# Patient Record
Sex: Female | Born: 1975 | Race: White | Hispanic: No | Marital: Married | Smoking: Never smoker
Health system: Southern US, Community
[De-identification: ages and names within clinical notes are randomized; demographics above are authoritative.]

## PROBLEM LIST (undated history)

## (undated) DIAGNOSIS — S62109A Fracture of unspecified carpal bone, unspecified wrist, initial encounter for closed fracture: Secondary | ICD-10-CM

## (undated) DIAGNOSIS — R519 Headache, unspecified: Secondary | ICD-10-CM

## (undated) DIAGNOSIS — R011 Cardiac murmur, unspecified: Secondary | ICD-10-CM

## (undated) DIAGNOSIS — R51 Headache: Secondary | ICD-10-CM

## (undated) HISTORY — DX: Cardiac murmur, unspecified: R01.1

## (undated) HISTORY — PX: WISDOM TOOTH EXTRACTION: SHX21

---

## 2007-03-27 ENCOUNTER — Inpatient Hospital Stay (HOSPITAL_COMMUNITY): Admission: AD | Admit: 2007-03-27 | Discharge: 2007-03-29 | Payer: Self-pay | Admitting: Certified Nurse Midwife

## 2008-12-10 ENCOUNTER — Inpatient Hospital Stay (HOSPITAL_COMMUNITY): Admission: AD | Admit: 2008-12-10 | Discharge: 2008-12-11 | Payer: Self-pay | Admitting: Obstetrics and Gynecology

## 2010-07-31 ENCOUNTER — Inpatient Hospital Stay (HOSPITAL_COMMUNITY)
Admission: AD | Admit: 2010-07-31 | Discharge: 2010-08-01 | Payer: Self-pay | Source: Home / Self Care | Attending: Obstetrics & Gynecology | Admitting: Obstetrics & Gynecology

## 2010-08-06 LAB — RH IMMUNE GLOB WKUP(>/=20WKS)(NOT WOMEN'S HOSP)
Fetal Screen: NEGATIVE
Unit division: 0

## 2010-08-06 LAB — RPR: RPR Ser Ql: NONREACTIVE

## 2010-08-06 LAB — CBC
HCT: 30.3 % — ABNORMAL LOW (ref 36.0–46.0)
Hemoglobin: 10.2 g/dL — ABNORMAL LOW (ref 12.0–15.0)
MCH: 30.2 pg (ref 26.0–34.0)
MCHC: 33.7 g/dL (ref 30.0–36.0)
MCV: 89.6 fL (ref 78.0–100.0)
Platelets: 206 10*3/uL (ref 150–400)
RBC: 3.38 MIL/uL — ABNORMAL LOW (ref 3.87–5.11)
RDW: 13.6 % (ref 11.5–15.5)
WBC: 11.8 10*3/uL — ABNORMAL HIGH (ref 4.0–10.5)

## 2010-10-30 LAB — CBC
HCT: 32 % — ABNORMAL LOW (ref 36.0–46.0)
Hemoglobin: 11.1 g/dL — ABNORMAL LOW (ref 12.0–15.0)
MCHC: 34.8 g/dL (ref 30.0–36.0)
MCV: 91.8 fL (ref 78.0–100.0)
Platelets: 190 10*3/uL (ref 150–400)
RBC: 3.48 MIL/uL — ABNORMAL LOW (ref 3.87–5.11)
RDW: 14.2 % (ref 11.5–15.5)
WBC: 13.6 10*3/uL — ABNORMAL HIGH (ref 4.0–10.5)

## 2010-10-30 LAB — RH IMMUNE GLOB WKUP(>/=20WKS)(NOT WOMEN'S HOSP): Fetal Screen: NEGATIVE

## 2010-10-30 LAB — RPR: RPR Ser Ql: NONREACTIVE

## 2011-05-01 ENCOUNTER — Encounter: Payer: Self-pay | Admitting: *Deleted

## 2011-05-02 ENCOUNTER — Encounter: Payer: Self-pay | Admitting: Cardiovascular Disease

## 2011-05-02 ENCOUNTER — Ambulatory Visit (HOSPITAL_COMMUNITY): Payer: 59 | Attending: Cardiovascular Disease | Admitting: Radiology

## 2011-05-02 ENCOUNTER — Ambulatory Visit (INDEPENDENT_AMBULATORY_CARE_PROVIDER_SITE_OTHER): Payer: Commercial Managed Care - PPO | Admitting: Cardiovascular Disease

## 2011-05-02 VITALS — BP 123/67 | HR 75 | Ht 62.0 in | Wt 143.0 lb

## 2011-05-02 DIAGNOSIS — O99891 Other specified diseases and conditions complicating pregnancy: Secondary | ICD-10-CM | POA: Insufficient documentation

## 2011-05-02 DIAGNOSIS — R011 Cardiac murmur, unspecified: Secondary | ICD-10-CM | POA: Insufficient documentation

## 2011-05-02 DIAGNOSIS — Z331 Pregnant state, incidental: Secondary | ICD-10-CM | POA: Insufficient documentation

## 2011-05-02 NOTE — Assessment & Plan Note (Signed)
F/U Barbara Bishop.  No complications to date

## 2011-05-02 NOTE — Assessment & Plan Note (Signed)
Outflow tract AV murmur not mitral.  Echo  Think it is benign.

## 2011-05-02 NOTE — Progress Notes (Signed)
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 Referred by [facility] for murmur.  History of murmur since young.  Reviewed echo from 2003 and only physiologic MR and TR with no MVP.  Study done at [facility] in [place].  Patient has 4 children and just discovered she is pregnant again.  No due date set yet.  No problems with previous pregnancies.  Active just ran 5K.  No SSCP, dyspne palpitations or edema.  Other children range in age from 18 months to 7 years.  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  No preeclapsia or gestational DM history  ROS: Denies fever, malais, weight loss, blurry vision, decreased visual acuity, cough, sputum, SOB, hemoptysis, pleuritic pain, palpitaitons, heartburn, abdominal pain, melena, lower extremity edema, claudication, or rash.  All other systems reviewed and negative   General: Affect appropriate Healthy:  appears stated age HEENT: normal Neck supple with no adenopathy JVP normal no bruits no thyromegaly Lungs clear with no wheezing and good diaphragmatic motion Heart:  S1/S2 SEM aortic no ,rub, gallop or click PMI normal Abdomen: benighn, BS positve, no tenderness, no AAA no bruit.  No HSM or HJR Distal pulses intact with no bruits No edema Neuro non-focal Skin warm and dry No muscular weakness  Medications Current Outpatient Prescriptions  Medication Sig Dispense Refill  . acetaminophen (TYLENOL) 325 MG tablet Take 650 mg by mouth every 6 (six) hours as needed.        . Prenatal MV-Min-Fe Fum-FA-DHA (PRENATAL 1 PO) Take 1 tablet by mouth daily.          Allergies Review of patient's allergies indicates no known allergies.  Family History: Family History  Problem Relation Age of Onset  . Bipolar disorder    . Eating disorder    . Anxiety disorder    . Fibromyalgia    . Heart disease      Social History: History   Social History  . Marital Status: Married    Spouse Name: N/A    Number of Children: N/A  . Years of  Education: N/A   Occupational History  . Not on file.   Social History Main Topics  . Smoking status: Never Smoker   . Smokeless tobacco: Not on file  . Alcohol Use: Yes  . Drug Use: Not on file  . Sexually Active: Not on file   Other Topics Concern  . Not on file   Social History Narrative  . No narrative on file    Electrocardiogram:  NSR 69 normal ECG  Assessment and Plan

## 2011-05-02 NOTE — Patient Instructions (Signed)
Your physician has requested that you have an echocardiogram. Echocardiography is a painless test that uses sound waves to create images of your heart. It provides your doctor with information about the size and shape of your heart and how well your heart's chambers and valves are working. This procedure takes approximately one hour. There are no restrictions for this procedure.   

## 2011-05-03 LAB — CBC
HCT: 33.4 — ABNORMAL LOW
Hemoglobin: 11.6 — ABNORMAL LOW
MCHC: 34.8
MCV: 86.8
Platelets: 281
RBC: 3.84 — ABNORMAL LOW
RDW: 13.9
WBC: 18.5 — ABNORMAL HIGH

## 2011-05-03 LAB — RPR: RPR Ser Ql: NONREACTIVE

## 2011-06-19 ENCOUNTER — Encounter: Payer: Self-pay | Admitting: Cardiovascular Disease

## 2011-07-23 NOTE — L&D Delivery Note (Signed)
Delivery Note    Into water tub at 0500, water temp 100F Out of tub at 0600 for void, ambulated in hallway x 15 min then returned to tub at 0620 Complete dilation at 0626 and SROM clear AF Onset of pushing at 0626, one push for head FHR second stage cat 1, intermittent EFM  Analgesia /Anesthesia intrapartum: none  Delivery of a viable female at [phone] by CNM in OA position. Patient kneeling on one knee in water Nuchal Cord x2, and around arm x 1, unwound after delivery. Cord double clamped after cessation of pulsation, cut by FOB.  Cord blood sample collected.  Arterial cord blood sample not done. Pt out of water for placenta delivery. Placenta delivered shultz intact with 3 VC.  Placenta to L&D for disposal. Uterine tone firm w/ massage and Pitocin 10 units IM, bleeding small  1st degree vaginal laceration identified.  Anesthesia: local lidocaine Repair 3.0 vycril in standard fashion Est. Blood Loss (mL): 200cc  Complications: none  Mom to postpartum.  Baby to mother for skin to skin care.  Sharri Loya 12/13/2011, 6:57 AM

## 2011-09-17 LAB — OB RESULTS CONSOLE ABO/RH

## 2011-09-17 LAB — OB RESULTS CONSOLE GC/CHLAMYDIA: Gonorrhea: NEGATIVE

## 2011-09-17 LAB — OB RESULTS CONSOLE ANTIBODY SCREEN: Antibody Screen: NEGATIVE

## 2011-11-19 LAB — OB RESULTS CONSOLE GBS: GBS: NEGATIVE

## 2011-12-13 ENCOUNTER — Inpatient Hospital Stay (HOSPITAL_COMMUNITY)
Admission: AD | Admit: 2011-12-13 | Discharge: 2011-12-14 | DRG: 775 | Disposition: A | Discharge status: Home / Self Care | Payer: 59 | Source: Ambulatory Visit | Attending: Obstetrics and Gynecology | Admitting: Obstetrics and Gynecology

## 2011-12-13 ENCOUNTER — Encounter (HOSPITAL_COMMUNITY): Payer: Self-pay | Admitting: Obstetrics

## 2011-12-13 DIAGNOSIS — D62 Acute posthemorrhagic anemia: Secondary | ICD-10-CM | POA: Diagnosis not present

## 2011-12-13 DIAGNOSIS — O9903 Anemia complicating the puerperium: Secondary | ICD-10-CM | POA: Diagnosis not present

## 2011-12-13 LAB — RPR: RPR Ser Ql: NONREACTIVE

## 2011-12-13 LAB — CBC
Hemoglobin: 11.5 g/dL — ABNORMAL LOW (ref 12.0–15.0)
MCHC: 32.9 g/dL (ref 30.0–36.0)
RDW: 13.3 % (ref 11.5–15.5)
WBC: 11.2 10*3/uL — ABNORMAL HIGH (ref 4.0–10.5)

## 2011-12-13 MED ORDER — ACETAMINOPHEN 325 MG PO TABS: 650.0000 mg | ORAL_TABLET | ORAL | Status: DC | PRN

## 2011-12-13 MED ORDER — WITCH HAZEL-GLYCERIN EX PADS: 1.0000 "application " | MEDICATED_PAD | CUTANEOUS | Status: DC | PRN

## 2011-12-13 MED ORDER — LIDOCAINE HCL (PF) 1 % IJ SOLN
30.0000 mL | INTRAMUSCULAR | Status: DC | PRN
  Administered 2011-12-13: 30 mL via SUBCUTANEOUS
  Filled 2011-12-13: qty 30

## 2011-12-13 MED ORDER — OXYTOCIN BOLUS FROM INFUSION
500.0000 mL | Freq: Once | INTRAVENOUS | Status: DC
  Filled 2011-12-13: qty 500

## 2011-12-13 MED ORDER — TETANUS-DIPHTH-ACELL PERTUSSIS 5-2.5-18.5 LF-MCG/0.5 IM SUSP: 0.5000 mL | Freq: Once | INTRAMUSCULAR | Status: DC

## 2011-12-13 MED ORDER — OXYTOCIN 10 UNIT/ML IJ SOLN
10.0000 [IU] | Freq: Once | INTRAMUSCULAR | Status: DC
  Administered 2011-12-13: 10 [IU] via INTRAMUSCULAR
  Filled 2011-12-13: qty 2

## 2011-12-13 MED ORDER — FLEET ENEMA 7-19 GM/118ML RE ENEM: 1.0000 | ENEMA | Freq: Every day | RECTAL | Status: DC | PRN

## 2011-12-13 MED ORDER — SENNOSIDES-DOCUSATE SODIUM 8.6-50 MG PO TABS
2.0000 | ORAL_TABLET | Freq: Every day | ORAL | Status: DC
  Administered 2011-12-13: 2 via ORAL

## 2011-12-13 MED ORDER — ONDANSETRON HCL 4 MG/2ML IJ SOLN: 4.0000 mg | INTRAMUSCULAR | Status: DC | PRN

## 2011-12-13 MED ORDER — FLEET ENEMA 7-19 GM/118ML RE ENEM: 1.0000 | ENEMA | RECTAL | Status: DC | PRN

## 2011-12-13 MED ORDER — ONDANSETRON HCL 4 MG PO TABS: 4.0000 mg | ORAL_TABLET | ORAL | Status: DC | PRN

## 2011-12-13 MED ORDER — CITRIC ACID-SODIUM CITRATE 334-500 MG/5ML PO SOLN: 30.0000 mL | ORAL | Status: DC | PRN

## 2011-12-13 MED ORDER — LANOLIN HYDROUS EX OINT: TOPICAL_OINTMENT | CUTANEOUS | Status: DC | PRN

## 2011-12-13 MED ORDER — PRENATAL MULTIVITAMIN CH
1.0000 | ORAL_TABLET | Freq: Every day | ORAL | Status: DC
  Administered 2011-12-13 – 2011-12-14 (×2): 1 via ORAL
  Filled 2011-12-13: qty 1

## 2011-12-13 MED ORDER — IBUPROFEN 600 MG PO TABS
600.0000 mg | ORAL_TABLET | Freq: Four times a day (QID) | ORAL | Status: DC
  Administered 2011-12-13 – 2011-12-14 (×2): 600 mg via ORAL
  Filled 2011-12-13 (×4): qty 1

## 2011-12-13 MED ORDER — IBUPROFEN 600 MG PO TABS: 600.0000 mg | ORAL_TABLET | Freq: Four times a day (QID) | ORAL | Status: DC | PRN

## 2011-12-13 MED ORDER — SIMETHICONE 80 MG PO CHEW: 80.0000 mg | CHEWABLE_TABLET | ORAL | Status: DC | PRN

## 2011-12-13 MED ORDER — DIBUCAINE 1 % RE OINT
1.0000 "application " | TOPICAL_OINTMENT | RECTAL | Status: DC | PRN
  Filled 2011-12-13: qty 28

## 2011-12-13 MED ORDER — OXYTOCIN 20 UNITS IN LACTATED RINGERS INFUSION - SIMPLE: 125.0000 mL/h | Freq: Once | INTRAVENOUS | Status: DC

## 2011-12-13 MED ORDER — OXYCODONE-ACETAMINOPHEN 5-325 MG PO TABS: 1.0000 | ORAL_TABLET | ORAL | Status: DC | PRN

## 2011-12-13 MED ORDER — ZOLPIDEM TARTRATE 5 MG PO TABS: 5.0000 mg | ORAL_TABLET | Freq: Every evening | ORAL | Status: DC | PRN

## 2011-12-13 MED ORDER — BISACODYL 10 MG RE SUPP
10.0000 mg | Freq: Every day | RECTAL | Status: DC | PRN
  Filled 2011-12-13: qty 1

## 2011-12-13 MED ORDER — BENZOCAINE-MENTHOL 20-0.5 % EX AERO
1.0000 "application " | INHALATION_SPRAY | CUTANEOUS | Status: DC | PRN
  Administered 2011-12-13: 1 via TOPICAL
  Filled 2011-12-13 (×2): qty 56

## 2011-12-13 MED ORDER — LACTATED RINGERS IV SOLN: 500.0000 mL | INTRAVENOUS | Status: DC | PRN

## 2011-12-13 MED ORDER — ONDANSETRON HCL 4 MG/2ML IJ SOLN: 4.0000 mg | Freq: Four times a day (QID) | INTRAMUSCULAR | Status: DC | PRN

## 2011-12-13 MED ORDER — DIPHENHYDRAMINE HCL 25 MG PO CAPS: 25.0000 mg | ORAL_CAPSULE | Freq: Four times a day (QID) | ORAL | Status: DC | PRN

## 2011-12-13 NOTE — Progress Notes (Signed)
Patient and baby were walked to [facility] by RN. RN gave report to [facility] RN, Donzetta Sprung.

## 2011-12-13 NOTE — H&P (Signed)
  OB ADMISSION/ HISTORY & PHYSICAL:  Admission Date: 12/13/2011  4:01 AM  Admit Diagnosis: 1. Term pregnancy  2. Contractions, hx of precipitous labor.  Barbara Bishop is a 36 y.o. female presenting for labor. Onset of painful ctx at 0100, small bloody show, + back pain. Plans water birth.  Prenatal History: [id number], SVD x4   EDC : 12/14/2011, by Other Basis  Prenatal care at [facility] since [redacted] weeks gestation  Prenatal course complicated by hx precipitous labor, Rh negative  Prenatal Labs: ABO, Rh: O (02/26 0000)  Antibody: Negative (02/26 0000) Rubella: Immune (02/26 0000)  RPR: Nonreactive (02/26 0000)  HBsAg:   negative HIV: Non-reactive (02/26 0000)  GBS: Negative (04/30 0000)  1 hr Glucola : 123 18 wks sono - nl anatomy, anterior placenta, unreported gender  Medical / Surgical History :  Past medical history:  Past Medical History  Diagnosis Date  . Murmur      Past surgical history:  Past Surgical History  Procedure Date  . Wisdom tooth extraction      Family History:  Family History  Problem Relation Age of Onset  . Bipolar disorder    . Eating disorder    . Anxiety disorder    . Fibromyalgia    . Heart disease       Social History:  reports that she has never smoked. She does not have any smokeless tobacco history on file. She reports that she does not drink alcohol. Her drug history not on file.   Allergies: Review of patient's allergies indicates no known allergies.    Current Medications at time of admission:  Prescriptions prior to admission  Medication Sig Dispense Refill  . acetaminophen (TYLENOL) 325 MG tablet Take 650 mg by mouth every 6 (six) hours as needed.        . Prenatal MV-Min-Fe Fum-FA-DHA (PRENATAL 1 PO) Take 1 tablet by mouth daily.            Review of Systems: Mild nausea w/ ctx, back pain, small bloody show, + fetal movement Coping well w/ ctx.   Physical Exam:  Dilation: 8 Exam by:: D Dreux Mcgroarty  CNM Filed Vitals:   12/13/11 0415 12/13/11 0431  BP: 138/67   Pulse: 82   Temp: 98 F (36.7 C)   TempSrc: Oral   Resp: 18   Height:  5\' 2"  (1.575 m)  Weight:  77.111 kg (170 lb)   General: AAO x 3, NAD Heart:RRR Lungs:CTAB Abdomen: nt, relaxed tone, EFW 7.5 Extremities: trace edema Genitalia / VE: 8/90/-2, ballotable vertex FHR: 135 BL, moderate variability, + accel's, no decel's TOCO: Q5-6 min, moderate palp  Labs:    CBC/RPR pending   Assessment: Term pregnancy, advanced dilation Desires water birth GBS neg  Plan:  Admit to BS Routine L&D orders Prep room for water birth Anticipate transition shortly, will AROM PRN  Barbara Bishop 12/13/2011, 4:44 AM

## 2011-12-14 LAB — CBC
Hemoglobin: 9.3 g/dL — ABNORMAL LOW (ref 12.0–15.0)
MCH: 29.2 pg (ref 26.0–34.0)
RBC: 3.18 MIL/uL — ABNORMAL LOW (ref 3.87–5.11)
WBC: 10.8 10*3/uL — ABNORMAL HIGH (ref 4.0–10.5)

## 2011-12-14 MED ORDER — IBUPROFEN 600 MG PO TABS: ORAL_TABLET | ORAL | Status: DC

## 2011-12-14 MED ORDER — RHO D IMMUNE GLOBULIN 1500 UNIT/2ML IJ SOLN
300.0000 ug | Freq: Once | INTRAMUSCULAR | Status: AC
  Administered 2011-12-14: 300 ug via INTRAMUSCULAR
  Filled 2011-12-14: qty 2

## 2011-12-14 NOTE — Discharge Summary (Signed)
Obstetric Discharge Summary  Reason for Admission: onset of labor Prenatal Procedures: none Intrapartum Procedures: spontaneous vaginal delivery and water birth Postpartum Procedures: none Complications-Operative and Postpartum: mild acute blood loss anemia Hemoglobin  Date Value Range Status  12/14/2011 9.3* 12.0-15.0 (g/dL) Final     DELTA CHECK NOTED     REPEATED TO VERIFY     HCT  Date Value Range Status  12/14/2011 27.9* 36.0-46.0 (%) Final    Physical Exam:  General: alert, cooperative and no distress Lochia: appropriate Uterine Fundus: firm Incision: healing well DVT Evaluation: No evidence of DVT seen on physical exam.  Discharge Diagnoses: Term Pregnancy-delivered  Discharge Information: Date: 12/14/2011 Activity: pelvic rest Diet: routine Medications: PNV and Ibuprofen Condition: stable Instructions: refer to practice specific booklet Discharge to: home Follow-up Information    Follow up with [name], CNM. Schedule an appointment as soon as possible for a visit in 6 weeks.   Contact information:   [address] [place] [address] [phone]          Newborn Data: Live born female  Birth Weight: 6 lb 6 oz (2892 g) APGAR: 7, 9  Home with mother.  [name] 12/14/2011, 10:44 AM

## 2011-12-14 NOTE — Progress Notes (Signed)
Patient ID: Barbara Bishop, female   DOB: Feb 08, 1976, 36 y.o.   MRN: [id number]  PPD 1 SVD  S:  Reports feeling well / ready for discharge home             Tolerating po/ No nausea or vomiting             Bleeding is light             Pain controlled with occasional motrin             Up ad lib / ambulatory  Newborn breast feeding  / female newborn  O:  A & O x 3 NAD             VS: Blood pressure 100/64, pulse 64, temperature 97.6 F (36.4 C), temperature source Oral, resp. rate 18, height 5\' 2"  (1.575 m), weight 77.111 kg (170 lb), SpO2 97.00%, unknown if currently breastfeeding.  LABS: WBC/Hgb/Hct/Plts:  10.8/9.3/27.9/206 (05/25 0532)   Lungs: Clear and unlabored  Heart: regular rate and rhythm / no mumurs  Abdomen: soft, non-tender, non-distended              Fundus: firm, non-tender  Lochia: light  Extremities: no edema, no calf pain or tenderness  A: PPD # 1   Doing well - stable status P:  Routine post partum orders  Discharge to home  [name] 12/14/2011, 10:40 AM

## 2011-12-16 LAB — RH IG WORKUP (INCLUDES ABO/RH)
Antibody Screen: NEGATIVE
Fetal Screen: NEGATIVE
Gestational Age(Wks): 39.6

## 2014-02-26 ENCOUNTER — Encounter: Payer: 59 | Attending: Family Medicine | Admitting: Dietician

## 2014-02-26 DIAGNOSIS — Z6831 Body mass index (BMI) 31.0-31.9, adult: Secondary | ICD-10-CM | POA: Insufficient documentation

## 2014-02-26 DIAGNOSIS — Z713 Dietary counseling and surveillance: Secondary | ICD-10-CM | POA: Diagnosis present

## 2014-02-26 DIAGNOSIS — E663 Overweight: Secondary | ICD-10-CM | POA: Insufficient documentation

## 2014-03-02 NOTE — Progress Notes (Signed)
Patient was seen on 02/26/2014 for the Weight Loss Class at the [facility]. The following learning objectives were met by the patient during this class:   Describe healthy choices in each food group  Describe portion size of foods  Use plate method for meal planning  Demonstrate how to read Nutrition Facts food label  Set realistic goals for weight loss, diet changes, and physical activity.   Goals:  1. Make healthy food choices in each food group.  2. Reduce portion size of foods.  3. Increase fruit and vegetable intake.  4. Use plate method for meal planning.  5. Increase physical activity.   Handouts given:  1. Weight loss tips 2. Meal plan/portion card 2. Plate method  2. Food label handout

## 2014-05-23 ENCOUNTER — Encounter (HOSPITAL_COMMUNITY): Payer: Self-pay | Admitting: Obstetrics

## 2015-08-02 DIAGNOSIS — H5213 Myopia, bilateral: Secondary | ICD-10-CM | POA: Diagnosis not present

## 2016-02-08 DIAGNOSIS — Z6831 Body mass index (BMI) 31.0-31.9, adult: Secondary | ICD-10-CM | POA: Diagnosis not present

## 2016-02-08 DIAGNOSIS — Z01419 Encounter for gynecological examination (general) (routine) without abnormal findings: Secondary | ICD-10-CM | POA: Diagnosis not present

## 2016-04-25 DIAGNOSIS — Z0000 Encounter for general adult medical examination without abnormal findings: Secondary | ICD-10-CM | POA: Diagnosis not present

## 2016-04-25 DIAGNOSIS — E7800 Pure hypercholesterolemia, unspecified: Secondary | ICD-10-CM | POA: Diagnosis not present

## 2016-04-25 DIAGNOSIS — M545 Low back pain: Secondary | ICD-10-CM | POA: Diagnosis not present

## 2016-08-02 DIAGNOSIS — H5213 Myopia, bilateral: Secondary | ICD-10-CM | POA: Diagnosis not present

## 2016-08-05 DIAGNOSIS — H5213 Myopia, bilateral: Secondary | ICD-10-CM | POA: Diagnosis not present

## 2016-10-01 ENCOUNTER — Ambulatory Visit (INDEPENDENT_AMBULATORY_CARE_PROVIDER_SITE_OTHER): Payer: 59

## 2016-10-01 ENCOUNTER — Encounter (HOSPITAL_COMMUNITY): Payer: Self-pay | Admitting: Emergency Medicine

## 2016-10-01 ENCOUNTER — Ambulatory Visit (HOSPITAL_COMMUNITY)
Admission: EM | Admit: 2016-10-01 | Discharge: 2016-10-01 | Disposition: A | Discharge status: Home / Self Care | Payer: 59 | Attending: Internal Medicine | Admitting: Internal Medicine

## 2016-10-01 DIAGNOSIS — S52512A Displaced fracture of left radial styloid process, initial encounter for closed fracture: Secondary | ICD-10-CM

## 2016-10-01 DIAGNOSIS — W009XXA Unspecified fall due to ice and snow, initial encounter: Secondary | ICD-10-CM

## 2016-10-01 DIAGNOSIS — M25532 Pain in left wrist: Secondary | ICD-10-CM | POA: Diagnosis not present

## 2016-10-01 DIAGNOSIS — S52592A Other fractures of lower end of left radius, initial encounter for closed fracture: Secondary | ICD-10-CM | POA: Diagnosis not present

## 2016-10-01 MED ORDER — NAPROXEN 500 MG PO TABS: 500.0000 mg | ORAL_TABLET | Freq: Two times a day (BID) | ORAL | 0 refills | Status: DC

## 2016-10-01 NOTE — ED Provider Notes (Signed)
[facility]    CSN: [id number] Arrival date & time: 10/01/16  1014     History   Chief Complaint Chief Complaint  Patient presents with  . Wrist Pain    HPI Barbara Bishop is a 41 y.o. female. She presents today with pain and swelling in the left wrist. Painful to rotate the wrist or move her left thumb. She slipped on ice getting out of the car this morning after work. She is a Marine scientist in the SICU. No neck or back pain, did not hit her head. No other injuries reported.    HPI  Past Medical History:  Diagnosis Date  . Headache    migraines  . Murmur   . PPcare - NVB / water 5/24 12/13/2011  . Wrist fracture    left    Patient Active Problem List   Diagnosis Date Noted  . PPcare - NVB / water 5/24 12/13/2011  . Murmur 05/02/2011  . Pregnancy as incidental finding 05/02/2011    Past Surgical History:  Procedure Laterality Date  . ORIF WRIST FRACTURE Left 10/03/2016   Procedure: OPEN REDUCTION INTERNAL FIXATION (ORIF) LEFT WRIST FRACTURE;  Surgeon: Roseanne Kaufman, MD;  Location: [facility];  Service: Orthopedics;  Laterality: Left;  . WISDOM TOOTH EXTRACTION      OB History    Gravida Para Term Preterm AB Living   5 5 5     1    SAB TAB Ectopic Multiple Live Births           1       Home Medications    Prior to Admission medications   Medication Sig Start Date End Date Taking? Authorizing Provider  acetaminophen (TYLENOL) 500 MG tablet Take 1,000 mg by mouth 2 (two) times daily as needed for moderate pain.    Historical Provider, MD  cephALEXin (KEFLEX) 500 MG capsule Take 1 capsule (500 mg total) by mouth 4 (four) times daily. 10/03/16 10/08/16  Avelina Laine, PA-C  Methocarbamol (ROBAXIN PO) Take by mouth.    Historical Provider, MD  Multiple Vitamin (MULTIVITAMIN WITH MINERALS) TABS tablet Take 1 tablet by mouth daily.    Historical Provider, MD  OVER THE COUNTER MEDICATION Take 2 tablets by mouth once a week. Deep blue complex - otc vitamin complex  for pain    Historical Provider, MD  Oxycodone-Acetaminophen (PERCOCET PO) Take by mouth.    Historical Provider, MD  Probiotic CAPS Take 1 capsule by mouth daily.    Historical Provider, MD    Family History Family History  Problem Relation Age of Onset  . Bipolar disorder    . Eating disorder    . Anxiety disorder    . Fibromyalgia    . Heart disease      Social History Social History  Substance Use Topics  . Smoking status: Never Smoker  . Smokeless tobacco: Never Used  . Alcohol use Yes     Comment: occasional      Allergies   Pork-derived products and Shellfish allergy   Review of Systems Review of Systems  All other systems reviewed and are negative.    Physical Exam Triage Vital Signs ED Triage Vitals [10/01/16 1032]  Enc Vitals Group     BP 144/71     Pulse Rate 75     Resp 16     Temp 98.4 F (36.9 C)     Temp Source Oral     SpO2 100 %     Weight  Height      Pain Score 5     Pain Loc    Updated Vital Signs BP 144/71 (BP Location: Right Arm)   Pulse 75   Temp 98.4 F (36.9 C) (Oral)   Resp 16   LMP 09/17/2016 (Exact Date)   SpO2 100%   Physical Exam  Constitutional: She is oriented to person, place, and time. No distress.  Alert, nicely groomed  HENT:  Head: Atraumatic.  Eyes:  Conjugate gaze, no eye redness/drainage  Neck: Neck supple.  Cardiovascular: Normal rate.   Pulmonary/Chest: No respiratory distress.  Abdominal: She exhibits no distension.  Musculoskeletal: Normal range of motion.  No leg swelling Slight swelling over the distal left radius dorsally, mildly tender to palpation. No ulnar tenderness. Pain was wrist rotation and extension. Pain with thumb motion. No bruises are present.  Neurological: She is alert and oriented to person, place, and time.  Skin: Skin is warm and dry.  No cyanosis  Nursing note and vitals reviewed.    UC Treatments / Results   Radiology  EXAM: LEFT WRIST - COMPLETE 3+  VIEW  COMPARISON: None.  FINDINGS: There is a transverse slightly angulated fracture of the distal left radius. The distal left radial fragment tilts dorsally. This fracture does not appear to extend into the radiocarpal joint space. The ulnar styloid appears intact. The carpal bones are normal position.  IMPRESSION: Slightly angulated transverse fracture of the distal left radius.   Electronically Signed By: Ivar Drape M.D. On: 10/01/2016 10:43  Procedures Procedures (including critical care time) Radial gutter splint applied by ortho tech  Final Clinical Impressions(s) / UC Diagnoses   Final diagnoses:  Displaced fracture of left radial styloid process, initial encounter for closed fracture   Ice (10 minutes several times daily) and elevate left forearm to help with pain/swelling.  Rx naproxen sent to [facility] for pain; can take tylenol with this.  Let your supervisor know as soon as possible about injury.  Followup with orthopedics in the several days for casting.  New Prescriptions Discharge Medication List as of 10/01/2016 11:21 AM    START taking these medications   Details  naproxen (NAPROSYN) 500 MG tablet Take 1 tablet (500 mg total) by mouth 2 (two) times daily., Starting Tue 10/01/2016, Normal         Sherlene Shams, MD 10/04/16 2147

## 2016-10-01 NOTE — Discharge Instructions (Addendum)
Ice (10 minutes several times daily) and elevate left forearm to help with pain/swelling.  Rx naproxen sent to [facility] for pain; can take tylenol with this.  Let your supervisor know as soon as possible about injury.  Followup with orthopedics in the several days for casting.

## 2016-10-01 NOTE — ED Triage Notes (Signed)
The patient presented to the [facility] with a complaint of left wrist pain secondary to a slip and fall on ice this am. The patient stated that she heard a pop and did have swelling, some deformity and decreased ROM. The patient had good distal PMS and capillary refill.

## 2016-10-02 ENCOUNTER — Encounter (HOSPITAL_COMMUNITY): Payer: Self-pay | Admitting: *Deleted

## 2016-10-02 DIAGNOSIS — S52552A Other extraarticular fracture of lower end of left radius, initial encounter for closed fracture: Secondary | ICD-10-CM | POA: Diagnosis not present

## 2016-10-02 DIAGNOSIS — M25532 Pain in left wrist: Secondary | ICD-10-CM | POA: Diagnosis not present

## 2016-10-02 MED FILL — oxyCODONE HCL 5 MG TABS: 5 | 4 days supply | Qty: 40 | Fill #0

## 2016-10-02 MED FILL — METHOCARBAMOL 500 MG TABLET: 500 | 10 days supply | Qty: 40 | Fill #0

## 2016-10-02 NOTE — Progress Notes (Signed)
Pt denies SOB, chest pain, and being under the care of a cardiologist. Pt denies having a stress test and cardiac cath. Pt denies having a chest x ray and EKG within the last year. Pt denies recent labs. Pt made aware to stop taking Aspirin, vitamins, fish oil and herbal medications. Do not take any NSAIDs ie: Ibuprofen, Advil, Naproxen, BC and Goody Powder or any medication containing Aspirin. Pt verbalized understanding of all pre-op instructions.

## 2016-10-03 ENCOUNTER — Ambulatory Visit (HOSPITAL_COMMUNITY): Payer: 59 | Admitting: Certified Registered Nurse Anesthetist

## 2016-10-03 ENCOUNTER — Ambulatory Visit (HOSPITAL_COMMUNITY)
Admission: RE | Admit: 2016-10-03 | Discharge: 2016-10-03 | Disposition: A | Discharge status: Home / Self Care | Payer: 59 | Source: Ambulatory Visit | Attending: Orthopedic Surgery | Admitting: Orthopedic Surgery

## 2016-10-03 ENCOUNTER — Encounter (HOSPITAL_COMMUNITY)
Admission: RE | Disposition: A | Discharge status: Home / Self Care | Payer: Self-pay | Source: Ambulatory Visit | Attending: Orthopedic Surgery

## 2016-10-03 ENCOUNTER — Encounter (HOSPITAL_COMMUNITY): Payer: Self-pay | Admitting: *Deleted

## 2016-10-03 DIAGNOSIS — W19XXXA Unspecified fall, initial encounter: Secondary | ICD-10-CM | POA: Insufficient documentation

## 2016-10-03 DIAGNOSIS — S52592A Other fractures of lower end of left radius, initial encounter for closed fracture: Secondary | ICD-10-CM | POA: Insufficient documentation

## 2016-10-03 DIAGNOSIS — S52502A Unspecified fracture of the lower end of left radius, initial encounter for closed fracture: Secondary | ICD-10-CM | POA: Diagnosis not present

## 2016-10-03 DIAGNOSIS — S62102A Fracture of unspecified carpal bone, left wrist, initial encounter for closed fracture: Secondary | ICD-10-CM | POA: Diagnosis not present

## 2016-10-03 HISTORY — PX: ORIF WRIST FRACTURE: SHX2133

## 2016-10-03 HISTORY — DX: Fracture of unspecified carpal bone, unspecified wrist, initial encounter for closed fracture: S62.109A

## 2016-10-03 HISTORY — DX: Headache: R51

## 2016-10-03 HISTORY — DX: Headache, unspecified: R51.9

## 2016-10-03 LAB — CBC
HEMATOCRIT: 36.7 % (ref 36.0–46.0)
Hemoglobin: 11.8 g/dL — ABNORMAL LOW (ref 12.0–15.0)
MCH: 26.6 pg (ref 26.0–34.0)
MCHC: 32.2 g/dL (ref 30.0–36.0)
MCV: 82.7 fL (ref 78.0–100.0)
PLATELETS: 318 10*3/uL (ref 150–400)
RBC: 4.44 MIL/uL (ref 3.87–5.11)
RDW: 14.1 % (ref 11.5–15.5)
WBC: 8 10*3/uL (ref 4.0–10.5)

## 2016-10-03 LAB — BASIC METABOLIC PANEL
ANION GAP: 11 (ref 5–15)
BUN: 13 mg/dL (ref 6–20)
CALCIUM: 8.9 mg/dL (ref 8.9–10.3)
CO2: 21 mmol/L — AB (ref 22–32)
Chloride: 105 mmol/L (ref 101–111)
Creatinine, Ser: 0.7 mg/dL (ref 0.44–1.00)
GLUCOSE: 83 mg/dL (ref 65–99)
POTASSIUM: 4.8 mmol/L (ref 3.5–5.1)
Sodium: 137 mmol/L (ref 135–145)

## 2016-10-03 LAB — HCG, SERUM, QUALITATIVE: Preg, Serum: NEGATIVE

## 2016-10-03 LAB — SURGICAL PCR SCREEN
MRSA, PCR: NEGATIVE
STAPHYLOCOCCUS AUREUS: NEGATIVE

## 2016-10-03 SURGERY — OPEN REDUCTION INTERNAL FIXATION (ORIF) WRIST FRACTURE
Anesthesia: Monitor Anesthesia Care | Site: Wrist | Laterality: Left

## 2016-10-03 MED ORDER — PROPOFOL 500 MG/50ML IV EMUL
INTRAVENOUS | Status: DC | PRN
  Administered 2016-10-03: 75 ug/kg/min via INTRAVENOUS

## 2016-10-03 MED ORDER — POVIDONE-IODINE 10 % EX SWAB: 2.0000 "application " | Freq: Once | CUTANEOUS | Status: DC

## 2016-10-03 MED ORDER — CEPHALEXIN 500 MG PO CAPS: 500.0000 mg | ORAL_CAPSULE | Freq: Four times a day (QID) | ORAL | 0 refills | Status: AC

## 2016-10-03 MED ORDER — BUPIVACAINE-EPINEPHRINE (PF) 0.5% -1:200000 IJ SOLN
INTRAMUSCULAR | Status: DC | PRN
  Administered 2016-10-03: 30 mL via PERINEURAL

## 2016-10-03 MED ORDER — CEFAZOLIN SODIUM-DEXTROSE 2-4 GM/100ML-% IV SOLN
INTRAVENOUS | Status: AC
  Filled 2016-10-03: qty 100

## 2016-10-03 MED ORDER — PROPOFOL 10 MG/ML IV BOLUS
INTRAVENOUS | Status: DC | PRN
  Administered 2016-10-03 (×2): 20 mg via INTRAVENOUS

## 2016-10-03 MED ORDER — MIDAZOLAM HCL 2 MG/2ML IJ SOLN
INTRAMUSCULAR | Status: AC
  Administered 2016-10-03: 2 mg via INTRAVENOUS
  Filled 2016-10-03: qty 2

## 2016-10-03 MED ORDER — FENTANYL CITRATE (PF) 100 MCG/2ML IJ SOLN
100.0000 ug | Freq: Once | INTRAMUSCULAR | Status: AC
  Administered 2016-10-03: 100 ug via INTRAVENOUS

## 2016-10-03 MED ORDER — LIDOCAINE 2% (20 MG/ML) 5 ML SYRINGE
INTRAMUSCULAR | Status: DC | PRN
  Administered 2016-10-03: 40 mg via INTRAVENOUS

## 2016-10-03 MED ORDER — FENTANYL CITRATE (PF) 100 MCG/2ML IJ SOLN
INTRAMUSCULAR | Status: AC
  Administered 2016-10-03: 100 ug via INTRAVENOUS
  Filled 2016-10-03: qty 2

## 2016-10-03 MED ORDER — PHENYLEPHRINE 40 MCG/ML (10ML) SYRINGE FOR IV PUSH (FOR BLOOD PRESSURE SUPPORT)
PREFILLED_SYRINGE | INTRAVENOUS | Status: AC
  Filled 2016-10-03: qty 10

## 2016-10-03 MED ORDER — CHLORHEXIDINE GLUCONATE 4 % EX LIQD: 60.0000 mL | Freq: Once | CUTANEOUS | Status: DC

## 2016-10-03 MED ORDER — LACTATED RINGERS IV SOLN
INTRAVENOUS | Status: DC
  Administered 2016-10-03: 50 mL/h via INTRAVENOUS

## 2016-10-03 MED ORDER — LIDOCAINE 2% (20 MG/ML) 5 ML SYRINGE
INTRAMUSCULAR | Status: AC
  Filled 2016-10-03: qty 5

## 2016-10-03 MED ORDER — SODIUM CHLORIDE 0.9 % IR SOLN
Status: DC | PRN
  Administered 2016-10-03: 1000 mL

## 2016-10-03 MED ORDER — CEFAZOLIN SODIUM-DEXTROSE 2-4 GM/100ML-% IV SOLN
2.0000 g | INTRAVENOUS | Status: AC
  Administered 2016-10-03: 2 g via INTRAVENOUS

## 2016-10-03 MED ORDER — HYDROMORPHONE HCL 1 MG/ML IJ SOLN: 0.2500 mg | INTRAMUSCULAR | Status: DC | PRN

## 2016-10-03 MED ORDER — MIDAZOLAM HCL 2 MG/2ML IJ SOLN
2.0000 mg | Freq: Once | INTRAMUSCULAR | Status: AC
  Administered 2016-10-03: 2 mg via INTRAVENOUS

## 2016-10-03 MED FILL — CEPHALEXIN 500 MG CAPSULE: 500 | 5 days supply | Qty: 20 | Fill #0

## 2016-10-03 SURGICAL SUPPLY — 60 items
BANDAGE ACE 4X5 VEL STRL LF (GAUZE/BANDAGES/DRESSINGS) ×3 IMPLANT
BANDAGE ELASTIC 3 VELCRO ST LF (GAUZE/BANDAGES/DRESSINGS) ×3 IMPLANT
BIT DRILL 2.2 SS TIBIAL (BIT) ×2 IMPLANT
BLADE CLIPPER SURG (BLADE) IMPLANT
BNDG CMPR 9X4 STRL LF SNTH (GAUZE/BANDAGES/DRESSINGS) ×1
BNDG ESMARK 4X9 LF (GAUZE/BANDAGES/DRESSINGS) ×3 IMPLANT
BNDG GAUZE ELAST 4 BULKY (GAUZE/BANDAGES/DRESSINGS) ×9 IMPLANT
CANISTER SUCT 3000ML PPV (MISCELLANEOUS) ×3 IMPLANT
CORDS BIPOLAR (ELECTRODE) ×3 IMPLANT
COVER SURGICAL LIGHT HANDLE (MISCELLANEOUS) ×3 IMPLANT
CUFF TOURNIQUET SINGLE 18IN (TOURNIQUET CUFF) ×3 IMPLANT
CUFF TOURNIQUET SINGLE 24IN (TOURNIQUET CUFF) IMPLANT
DRAIN TLS ROUND 10FR (DRAIN) IMPLANT
DRAPE OEC MINIVIEW 54X84 (DRAPES) IMPLANT
DRAPE SURG 17X23 STRL (DRAPES) ×3 IMPLANT
GAUZE SPONGE 4X4 12PLY STRL (GAUZE/BANDAGES/DRESSINGS) ×3 IMPLANT
GAUZE XEROFORM 1X8 LF (GAUZE/BANDAGES/DRESSINGS) ×3 IMPLANT
GLOVE BIOGEL M 8.0 STRL (GLOVE) ×3 IMPLANT
GLOVE SS BIOGEL STRL SZ 8 (GLOVE) ×1 IMPLANT
GLOVE SUPERSENSE BIOGEL SZ 8 (GLOVE) ×2
GOWN STRL REUS W/ TWL LRG LVL3 (GOWN DISPOSABLE) ×1 IMPLANT
GOWN STRL REUS W/ TWL XL LVL3 (GOWN DISPOSABLE) ×2 IMPLANT
GOWN STRL REUS W/TWL LRG LVL3 (GOWN DISPOSABLE) ×3
GOWN STRL REUS W/TWL XL LVL3 (GOWN DISPOSABLE) ×6
KIT BASIN OR (CUSTOM PROCEDURE TRAY) ×3 IMPLANT
KIT ROOM TURNOVER OR (KITS) ×3 IMPLANT
LOOP VESSEL MAXI BLUE (MISCELLANEOUS) IMPLANT
MANIFOLD NEPTUNE II (INSTRUMENTS) ×3 IMPLANT
NEEDLE 22X1 1/2 (OR ONLY) (NEEDLE) IMPLANT
NS IRRIG 1000ML POUR BTL (IV SOLUTION) ×3 IMPLANT
PACK ORTHO EXTREMITY (CUSTOM PROCEDURE TRAY) ×3 IMPLANT
PAD ARMBOARD 7.5X6 YLW CONV (MISCELLANEOUS) ×6 IMPLANT
PAD CAST 3X4 CTTN HI CHSV (CAST SUPPLIES) ×1 IMPLANT
PAD CAST 4YDX4 CTTN HI CHSV (CAST SUPPLIES) ×1 IMPLANT
PADDING CAST COTTON 3X4 STRL (CAST SUPPLIES) ×3
PADDING CAST COTTON 4X4 STRL (CAST SUPPLIES) ×3
PEG LOCKING SMOOTH 2.2X12 (Peg) ×2 IMPLANT
PEG LOCKING SMOOTH 2.2X18 (Peg) ×8 IMPLANT
PLATE NARROW DVR LEFT (Plate) ×2 IMPLANT
SCREW LOCK 12X2.7X 3 LD (Screw) IMPLANT
SCREW LOCK 14X2.7X 3 LD TPR (Screw) IMPLANT
SCREW LOCKING 2.7X12MM (Screw) ×3 IMPLANT
SCREW LOCKING 2.7X13MM (Screw) ×2 IMPLANT
SCREW LOCKING 2.7X14 (Screw) ×6 IMPLANT
SCREW LOCKING 2.7X8MM (Screw) ×2 IMPLANT
SCRUB BETADINE 4OZ XXX (MISCELLANEOUS) ×3 IMPLANT
SOLUTION BETADINE 4OZ (MISCELLANEOUS) ×3 IMPLANT
SPLINT FIBERGLASS 3X12 (CAST SUPPLIES) ×2 IMPLANT
SPONGE LAP 4X18 X RAY DECT (DISPOSABLE) IMPLANT
SUT MNCRL AB 4-0 PS2 18 (SUTURE) ×3 IMPLANT
SUT PROLENE 3 0 PS 2 (SUTURE) IMPLANT
SUT VIC AB 3-0 FS2 27 (SUTURE) IMPLANT
SYR CONTROL 10ML LL (SYRINGE) IMPLANT
SYSTEM CHEST DRAIN TLS 7FR (DRAIN) ×2 IMPLANT
TOWEL OR 17X24 6PK STRL BLUE (TOWEL DISPOSABLE) ×1 IMPLANT
TOWEL OR 17X26 10 PK STRL BLUE (TOWEL DISPOSABLE) ×1 IMPLANT
TUBE CONNECTING 12'X1/4 (SUCTIONS) ×1
TUBE CONNECTING 12X1/4 (SUCTIONS) ×2 IMPLANT
TUBE EVACUATION TLS (MISCELLANEOUS) ×3 IMPLANT
WATER STERILE IRR 1000ML POUR (IV SOLUTION) ×1 IMPLANT

## 2016-10-03 NOTE — Discharge Instructions (Signed)
.  Keep bandage clean and dry.  Call for any problems.  No smoking.  Criteria for driving a car: you should be off your pain medicine for 7-8 hours, able to drive one handed(confident), thinking clearly and feeling able in your judgement to drive. Continue elevation as it will decrease swelling.  If instructed by MD move your fingers within the confines of the bandage/splint.  Use ice if instructed by your MD. Call immediately for any sudden loss of feeling in your hand/arm or change in functional abilities of the extremity.We recommend that you to take vitamin C 1000 mg a day to promote healing. We also recommend that if you require  pain medicine that you take a stool softener to prevent constipation as most pain medicines will have constipation side effects. We recommend either Peri-Colace or Senokot and recommend that you also consider adding MiraLAX as well to prevent the constipation affects from pain medicine if you are required to use them. These medicines are over the counter and may be purchased at a local pharmacy. A cup of yogurt and a probiotic can also be helpful during the recovery process as the medicines can disrupt your intestinal environment. Please change red top tube every 6 hours and as needed. You may pull the drain and discard it in the morning. Please call for questions or concerns. Dr. Vanetta Shawl cell number [phone].

## 2016-10-03 NOTE — Anesthesia Preprocedure Evaluation (Addendum)
Anesthesia Evaluation  Patient identified by MRN, date of birth, ID band Patient awake    Reviewed: Allergy & Precautions, H&P , NPO status , Patient's Chart, lab work & pertinent test results  Airway Mallampati: III  TM Distance: >3 FB Neck ROM: Full    Dental no notable dental hx. (+) Teeth Intact, Dental Advisory Given   Pulmonary neg pulmonary ROS,    Pulmonary exam normal breath sounds clear to auscultation       Cardiovascular negative cardio ROS   Rhythm:Regular Rate:Normal     Neuro/Psych  Headaches, negative psych ROS   GI/Hepatic negative GI ROS, Neg liver ROS,   Endo/Other  negative endocrine ROS  Renal/GU negative Renal ROS  negative genitourinary   Musculoskeletal   Abdominal   Peds  Hematology negative hematology ROS (+)   Anesthesia Other Findings   Reproductive/Obstetrics negative OB ROS                            Anesthesia Physical Anesthesia Plan  ASA: II  Anesthesia Plan: MAC and Regional   Post-op Pain Management:    Induction: Intravenous  Airway Management Planned: Simple Face Mask  Additional Equipment:   Intra-op Plan:   Post-operative Plan:   Informed Consent: I have reviewed the patients History and Physical, chart, labs and discussed the procedure including the risks, benefits and alternatives for the proposed anesthesia with the patient or authorized representative who has indicated his/her understanding and acceptance.   Dental advisory given  Plan Discussed with: CRNA  Anesthesia Plan Comments:        Anesthesia Quick Evaluation

## 2016-10-03 NOTE — H&P (Signed)
Barbara Bishop is an 41 y.o. female.   Chief Complaint: displaced left wrist fracture HPI: Patient presents for evaluation and treatment of the of their upper extremity predicament. The patient denies neck, back, chest or  abdominal pain. The patient notes that they have no lower extremity problems. The patients primary complaint is noted. We are planning surgical care pathway for the upper extremity.  Past Medical History:  Diagnosis Date  . Headache    migraines  . Murmur   . PPcare - NVB / water 5/24 12/13/2011  . Wrist fracture    left    Past Surgical History:  Procedure Laterality Date  . WISDOM TOOTH EXTRACTION      Family History  Problem Relation Age of Onset  . Bipolar disorder    . Eating disorder    . Anxiety disorder    . Fibromyalgia    . Heart disease     Social History:  reports that she has never smoked. She has never used smokeless tobacco. She reports that she drinks alcohol. She reports that she does not use drugs.  Allergies:  Allergies  Allergen Reactions  . Pork-Derived Products     Pt prefers not to have any pork products   . Shellfish Allergy     Pt prefers not to have any shellfish products    No prescriptions prior to admission.    No results found for this or any previous visit (from the past 48 hour(s)). Dg Wrist Complete Left  Result Date: 10/01/2016 CLINICAL DATA:  [place] today with some deformity of the wrist, pain and swelling EXAM: LEFT WRIST - COMPLETE 3+ VIEW COMPARISON:  None. FINDINGS: There is a transverse slightly angulated fracture of the distal left radius. The distal left radial fragment tilts dorsally. This fracture does not appear to extend into the radiocarpal joint space. The ulnar styloid appears intact. The carpal bones are normal position. IMPRESSION: Slightly angulated transverse fracture of the distal left radius. Electronically Signed   By: Ivar Drape M.D.   On: 10/01/2016 10:43    Review of Systems  Eyes: Negative.    Respiratory: Negative.   Cardiovascular: Negative.   Gastrointestinal: Negative.   Genitourinary: Negative.   Endo/Heme/Allergies: Negative.     Last menstrual period 09/17/2016, unknown if currently breastfeeding. Physical Exam  Displaced left wrist fracture NVI The patient is alert and oriented in no acute distress. The patient complains of pain in the affected upper extremity.  The patient is noted to have a normal HEENT exam. Lung fields show equal chest expansion and no shortness of breath. Abdomen exam is nontender without distention. Lower extremity examination does not show any fracture dislocation or blood clot symptoms. Pelvis is stable and the neck and back are stable and nontender. Assessment/Plan Plan left wrist fracure ORIF We are planning surgery for your upper extremity. The risk and benefits of surgery to include risk of bleeding, infection, anesthesia,  damage to normal structures and failure of the surgery to accomplish its intended goals of relieving symptoms and restoring function have been discussed in detail. With this in mind we plan to proceed. I have specifically discussed with the patient the pre-and postoperative regime and the dos and don'ts and risk and benefits in great detail. Risk and benefits of surgery also include risk of dystrophy(CRPS), chronic nerve pain, failure of the healing process to go onto completion and other inherent risks of surgery The relavent the pathophysiology of the disease/injury process, as well as the  alternatives for treatment and postoperative course of action has been discussed in great detail with the patient who desires to proceed.  We will do everything in our power to help you (the patient) restore function to the upper extremity. It is a pleasure to see this patient today.   Paulene Floor, MD 10/03/2016, 6:08 AM

## 2016-10-03 NOTE — Transfer of Care (Signed)
Immediate Anesthesia Transfer of Care Note  Patient: Barbara Bishop  Procedure(s) Performed: Procedure(s): OPEN REDUCTION INTERNAL FIXATION (ORIF) LEFT WRIST FRACTURE (Left)  Patient Location: PACU  Anesthesia Type:MAC combined with regional for post-op pain  Level of Consciousness: awake, oriented and patient cooperative  Airway & Oxygen Therapy: Patient Spontanous Breathing and Patient connected to nasal cannula oxygen  Post-op Assessment: Report given to RN, Post -op Vital signs reviewed and stable and Patient moving all extremities  Post vital signs: Reviewed and stable  Last Vitals:  Vitals:   10/03/16 1555 10/03/16 1610  BP: 129/75 134/69  Pulse: 67 71  Resp: 16 17  Temp:  36.3 C    Last Pain:  Vitals:   10/03/16 1311  TempSrc:   PainSc: 0-No pain      Patients Stated Pain Goal: 3 (57/32/20 2542)  Complications: No apparent anesthesia complications

## 2016-10-03 NOTE — Anesthesia Postprocedure Evaluation (Signed)
Anesthesia Post Note  Patient: Barbara Bishop  Procedure(s) Performed: Procedure(s) (LRB): OPEN REDUCTION INTERNAL FIXATION (ORIF) LEFT WRIST FRACTURE (Left)  Patient location during evaluation: PACU Anesthesia Type: Regional and MAC Level of consciousness: awake and alert and patient cooperative Pain management: pain level controlled Vital Signs Assessment: post-procedure vital signs reviewed and stable Respiratory status: spontaneous breathing and respiratory function stable Cardiovascular status: stable Anesthetic complications: no       Last Vitals:  Vitals:   10/03/16 1555 10/03/16 1610  BP: 129/75 134/69  Pulse: 67 71  Resp: 16 17  Temp:  36.3 C    Last Pain:  Vitals:   10/03/16 1311  TempSrc:   PainSc: 0-No pain                 Roch Quach DAVID

## 2016-10-03 NOTE — Anesthesia Procedure Notes (Signed)
Anesthesia Regional Block: Supraclavicular block   Pre-Anesthetic Checklist: ,, timeout performed, Correct Patient, Correct Site, Correct Laterality, Correct Procedure, Correct Position, site marked, Risks and benefits discussed, pre-op evaluation,  At surgeon's request and post-op pain management  Laterality: Left  Prep: Maximum Sterile Barrier Precautions used, chloraprep       Needles:  Injection technique: Single-shot  Needle Type: Echogenic Stimulator Needle     Needle Length: 5cm  Needle Gauge: 22     Additional Needles:   Procedures: ultrasound guided,,,,,,,,  Narrative:  Start time: 10/03/2016 12:48 PM End time: 10/03/2016 12:58 PM Injection made incrementally with aspirations every 5 mL. Anesthesiologist: Roderic Palau  Additional Notes: 2% Lidocaine skin wheel.

## 2016-10-04 ENCOUNTER — Encounter (HOSPITAL_COMMUNITY): Payer: Self-pay | Admitting: Orthopedic Surgery

## 2016-10-04 NOTE — Op Note (Signed)
See KCCQ#[id number] Amedeo Plenty MD

## 2016-10-04 NOTE — Op Note (Signed)
NAMEJUDIE, HOLLICK              ACCOUNT NO.:  [id number]  MEDICAL RECORD NO.:  [id number]  LOCATION:                                 FACILITY:  PHYSICIAN:  Satira Anis. Amedeo Plenty, M.D.     DATE OF BIRTH:  DATE OF PROCEDURE:  10/03/2016 DATE OF DISCHARGE:   10/03/2016                              OPERATIVE REPORT   PREOPERATIVE DIAGNOSIS:  Comminuted complex greater than 3-part distal radius fracture, left upper extremity.  POSTOPERATIVE DIAGNOSIS:  Comminuted complex greater than 3-part distal radius fracture, left upper extremity.  PROCEDURE: 1. Open reduction internal fixation, left distal radius fracture with     DVR narrow plate from [employer]. 2. AP, lateral, and oblique x-rays performed, examined, and     interpreted by myself, left wrist.  SURGEON:  Satira Anis. Amedeo Plenty, MD.  ASSISTANTAvelina Laine, P.A.-C.  COMPLICATIONS:  None.  INDICATIONS:  The patient is a very pleasant female, presents with the above-mentioned diagnosis.  I have counseled her regarding risks and benefits of surgery and she desires to proceed with the above-mentioned operative intervention.  All questions have been encouraged and answered preoperatively.  OPERATIVE PROCEDURE:  The patient was seen by myself and Anesthesia, taken to the operative theater and underwent smooth induction of IV sedation, was prepped and draped in usual sterile fashion with Betadine scrub and paint about the affected extremity.  Preoperatively, we gave her Hibiclens scrub, followed by the 10-minute surgical Betadine prep. She was given antibiotics, time-out was observed, and the arm was isolated with sterile field.  Following this, the patient then underwent a curvilinear incision at the distal volar radial region of the wrist. Dissection was carried down through the skin with knife, blade.  FCR tendon sheath was incised dorsally and palmarly.  Carpal canal contents retracted ulnarly and following this, we then very  carefully and cautiously incised the pronator, reflected it back, and then applied a DVR narrow plate.  Reduction was accomplished, held, and plate applied without difficulty in standard AO technique.  Radial height inclination and volar tilt were of excellent position and I was quite pleased this.  Following this, I then performed a 4-view radiographic series to document this, check screw lengths, and all looked well.  We then irrigated copiously with liter of saline, followed by closure of the pronator, followed by closure of the skin edge over drain with Prolene. The patient tolerated this well.  She was placed in short-arm splint, taken to recovery room.  She will remove the drain in the morning.  She has pain medicine, muscle relaxers.  We placed her on antibiotics postoperatively as well as outlined in the chart.  These notes have been discussed.  All questions have been encouraged and answered.  We will rehab her according to standard DVR algorithm.     Satira Anis. Amedeo Plenty, M.D.     [facility]  D:  10/04/2016  T:  10/04/2016  Job:  [id number]

## 2016-10-16 DIAGNOSIS — Z4789 Encounter for other orthopedic aftercare: Secondary | ICD-10-CM | POA: Diagnosis not present

## 2016-10-16 DIAGNOSIS — S52552D Other extraarticular fracture of lower end of left radius, subsequent encounter for closed fracture with routine healing: Secondary | ICD-10-CM | POA: Diagnosis not present

## 2016-10-31 DIAGNOSIS — S52552D Other extraarticular fracture of lower end of left radius, subsequent encounter for closed fracture with routine healing: Secondary | ICD-10-CM | POA: Diagnosis not present

## 2016-10-31 DIAGNOSIS — S62102A Fracture of unspecified carpal bone, left wrist, initial encounter for closed fracture: Secondary | ICD-10-CM | POA: Diagnosis not present

## 2016-11-05 DIAGNOSIS — M25532 Pain in left wrist: Secondary | ICD-10-CM | POA: Diagnosis not present

## 2016-11-08 DIAGNOSIS — S62102D Fracture of unspecified carpal bone, left wrist, subsequent encounter for fracture with routine healing: Secondary | ICD-10-CM | POA: Diagnosis not present

## 2016-11-12 DIAGNOSIS — S62102D Fracture of unspecified carpal bone, left wrist, subsequent encounter for fracture with routine healing: Secondary | ICD-10-CM | POA: Diagnosis not present

## 2016-11-14 DIAGNOSIS — S62102D Fracture of unspecified carpal bone, left wrist, subsequent encounter for fracture with routine healing: Secondary | ICD-10-CM | POA: Diagnosis not present

## 2016-11-19 DIAGNOSIS — S62102D Fracture of unspecified carpal bone, left wrist, subsequent encounter for fracture with routine healing: Secondary | ICD-10-CM | POA: Diagnosis not present

## 2016-11-21 DIAGNOSIS — S62102D Fracture of unspecified carpal bone, left wrist, subsequent encounter for fracture with routine healing: Secondary | ICD-10-CM | POA: Diagnosis not present

## 2016-11-28 DIAGNOSIS — S62102D Fracture of unspecified carpal bone, left wrist, subsequent encounter for fracture with routine healing: Secondary | ICD-10-CM | POA: Diagnosis not present

## 2016-11-28 DIAGNOSIS — Z4789 Encounter for other orthopedic aftercare: Secondary | ICD-10-CM | POA: Diagnosis not present

## 2016-12-03 DIAGNOSIS — S62102D Fracture of unspecified carpal bone, left wrist, subsequent encounter for fracture with routine healing: Secondary | ICD-10-CM | POA: Diagnosis not present

## 2016-12-11 DIAGNOSIS — S62102D Fracture of unspecified carpal bone, left wrist, subsequent encounter for fracture with routine healing: Secondary | ICD-10-CM | POA: Diagnosis not present

## 2016-12-18 DIAGNOSIS — M25532 Pain in left wrist: Secondary | ICD-10-CM | POA: Diagnosis not present

## 2016-12-26 DIAGNOSIS — S62102D Fracture of unspecified carpal bone, left wrist, subsequent encounter for fracture with routine healing: Secondary | ICD-10-CM | POA: Diagnosis not present

## 2016-12-26 DIAGNOSIS — Z4789 Encounter for other orthopedic aftercare: Secondary | ICD-10-CM | POA: Diagnosis not present

## 2016-12-26 DIAGNOSIS — M25532 Pain in left wrist: Secondary | ICD-10-CM | POA: Diagnosis not present

## 2017-01-21 ENCOUNTER — Ambulatory Visit (INDEPENDENT_AMBULATORY_CARE_PROVIDER_SITE_OTHER): Payer: 59

## 2017-01-21 ENCOUNTER — Encounter: Payer: Self-pay | Admitting: Podiatry

## 2017-01-21 ENCOUNTER — Ambulatory Visit (INDEPENDENT_AMBULATORY_CARE_PROVIDER_SITE_OTHER): Payer: 59 | Admitting: Podiatry

## 2017-01-21 VITALS — BP 136/93 | HR 77 | Resp 16 | Ht 62.0 in | Wt 168.0 lb

## 2017-01-21 DIAGNOSIS — M7661 Achilles tendinitis, right leg: Secondary | ICD-10-CM

## 2017-01-21 MED ORDER — MELOXICAM 15 MG PO TABS: 15.0000 mg | ORAL_TABLET | Freq: Every day | ORAL | 3 refills | Status: AC

## 2017-01-21 MED ORDER — METHYLPREDNISOLONE 4 MG PO TBPK: ORAL_TABLET | ORAL | 0 refills | Status: AC

## 2017-01-21 MED FILL — METHYLPREDNISOLONE 4 MG TAB: 4 | 6 days supply | Qty: 21 | Fill #0

## 2017-01-21 MED FILL — MELOXICAM 15 MG TABLET: 15 | 30 days supply | Qty: 30 | Fill #0

## 2017-01-21 NOTE — Progress Notes (Signed)
   Subjective:    Patient ID: Barbara Bishop, female    DOB: [birth year], 41 y.o.   MRN: [id number]  HPI: She presents today with a chief complaint of pain to the right foot. She states it is the back of the heel and stated that foot gets worse and better as time goes on. She states that she has a bump on the back of the heel that is painful and clicks when she walks. She states that she can hear cracking or snapping or popping sound from the back of the ankle. She states this is been going on now for the past 6-7 weeks. She is an ICU nurse.    Review of Systems  All other systems reviewed and are negative.      Objective:   Physical Exam: Vital signs are stable alert and oriented 3. Pulses are palpable. Neurologic sensorium is intact. Deep tendon reflexes are intact. Muscle strength +5 over 5 dorsiflexion plantar flexors and inverters everters all just musculature is intact. Orthopedic evaluation joints distal to the ankle for range of motion without crepitation. She has tenderness on palpation of the obvious Haglund's deformity right heel. This is nonpulsatile firm mass to the posterior superior lateral aspect of the right heel. Radiographs confirm a large posterior superior lateral process of the calcaneus. Minimal increase in density at the insertion site of the Achilles. Then it is slightly thick but not significantly so. Otherwise the foot appears to be rectus and relatively normal.        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  Plan: At this point I highly recommended steroidal anti-inflammatory followed by a nonsteroidal and a plantar fascial night splint. Medications were sent over to her pharmacy and I will follow-up with her in 1 month. We also measured her today for orthotics.

## 2017-01-21 NOTE — Patient Instructions (Signed)

## 2017-02-18 ENCOUNTER — Other Ambulatory Visit: Payer: 59 | Admitting: Orthotics

## 2017-02-20 DIAGNOSIS — Z6831 Body mass index (BMI) 31.0-31.9, adult: Secondary | ICD-10-CM | POA: Diagnosis not present

## 2017-02-20 DIAGNOSIS — Z01419 Encounter for gynecological examination (general) (routine) without abnormal findings: Secondary | ICD-10-CM | POA: Diagnosis not present

## 2017-02-20 DIAGNOSIS — Z1231 Encounter for screening mammogram for malignant neoplasm of breast: Secondary | ICD-10-CM | POA: Diagnosis not present

## 2017-02-20 DIAGNOSIS — Z1151 Encounter for screening for human papillomavirus (HPV): Secondary | ICD-10-CM | POA: Diagnosis not present

## 2017-02-25 ENCOUNTER — Other Ambulatory Visit: Payer: Self-pay | Admitting: Certified Nurse Midwife

## 2017-02-25 DIAGNOSIS — R928 Other abnormal and inconclusive findings on diagnostic imaging of breast: Secondary | ICD-10-CM

## 2017-02-28 ENCOUNTER — Ambulatory Visit
Admission: RE | Admit: 2017-02-28 | Discharge: 2017-02-28 | Disposition: A | Discharge status: Home / Self Care | Payer: 59 | Source: Ambulatory Visit | Attending: Certified Nurse Midwife | Admitting: Certified Nurse Midwife

## 2017-02-28 ENCOUNTER — Other Ambulatory Visit: Payer: Self-pay | Admitting: Certified Nurse Midwife

## 2017-02-28 DIAGNOSIS — N6320 Unspecified lump in the left breast, unspecified quadrant: Secondary | ICD-10-CM | POA: Diagnosis not present

## 2017-02-28 DIAGNOSIS — R922 Inconclusive mammogram: Secondary | ICD-10-CM | POA: Diagnosis not present

## 2017-02-28 DIAGNOSIS — R928 Other abnormal and inconclusive findings on diagnostic imaging of breast: Secondary | ICD-10-CM

## 2017-07-18 DIAGNOSIS — G5621 Lesion of ulnar nerve, right upper limb: Secondary | ICD-10-CM | POA: Diagnosis not present

## 2017-07-18 DIAGNOSIS — J101 Influenza due to other identified influenza virus with other respiratory manifestations: Secondary | ICD-10-CM | POA: Diagnosis not present

## 2017-07-18 DIAGNOSIS — R202 Paresthesia of skin: Secondary | ICD-10-CM | POA: Diagnosis not present

## 2017-07-18 MED FILL — NAPROXEN 500 MG TABLET: 500 | 30 days supply | Qty: 60 | Fill #0

## 2017-08-01 DIAGNOSIS — H5213 Myopia, bilateral: Secondary | ICD-10-CM | POA: Diagnosis not present

## 2017-09-04 ENCOUNTER — Ambulatory Visit
Admission: RE | Admit: 2017-09-04 | Discharge: 2017-09-04 | Disposition: A | Discharge status: Home / Self Care | Payer: 59 | Source: Ambulatory Visit | Attending: Certified Nurse Midwife | Admitting: Certified Nurse Midwife

## 2017-09-04 ENCOUNTER — Other Ambulatory Visit: Payer: Self-pay | Admitting: Certified Nurse Midwife

## 2017-09-04 DIAGNOSIS — R928 Other abnormal and inconclusive findings on diagnostic imaging of breast: Secondary | ICD-10-CM

## 2017-09-04 DIAGNOSIS — N6320 Unspecified lump in the left breast, unspecified quadrant: Secondary | ICD-10-CM | POA: Diagnosis not present

## 2017-09-04 DIAGNOSIS — N6489 Other specified disorders of breast: Secondary | ICD-10-CM

## 2017-09-04 DIAGNOSIS — R922 Inconclusive mammogram: Secondary | ICD-10-CM | POA: Diagnosis not present

## 2017-09-10 ENCOUNTER — Ambulatory Visit
Admission: RE | Admit: 2017-09-10 | Discharge: 2017-09-10 | Disposition: A | Discharge status: Home / Self Care | Payer: 59 | Source: Ambulatory Visit | Attending: Certified Nurse Midwife | Admitting: Certified Nurse Midwife

## 2017-09-10 DIAGNOSIS — N6489 Other specified disorders of breast: Secondary | ICD-10-CM

## 2017-09-10 DIAGNOSIS — N6320 Unspecified lump in the left breast, unspecified quadrant: Secondary | ICD-10-CM

## 2017-09-10 DIAGNOSIS — D242 Benign neoplasm of left breast: Secondary | ICD-10-CM | POA: Diagnosis not present

## 2017-09-10 DIAGNOSIS — N6323 Unspecified lump in the left breast, lower outer quadrant: Secondary | ICD-10-CM | POA: Diagnosis not present

## 2017-09-10 DIAGNOSIS — N6324 Unspecified lump in the left breast, lower inner quadrant: Secondary | ICD-10-CM | POA: Diagnosis not present

## 2017-10-22 DIAGNOSIS — E7800 Pure hypercholesterolemia, unspecified: Secondary | ICD-10-CM | POA: Diagnosis not present

## 2017-10-22 DIAGNOSIS — Z0000 Encounter for general adult medical examination without abnormal findings: Secondary | ICD-10-CM | POA: Diagnosis not present

## 2017-10-22 DIAGNOSIS — R030 Elevated blood-pressure reading, without diagnosis of hypertension: Secondary | ICD-10-CM | POA: Diagnosis not present

## 2017-12-26 DIAGNOSIS — H6693 Otitis media, unspecified, bilateral: Secondary | ICD-10-CM | POA: Diagnosis not present

## 2018-01-02 MED FILL — AMOXICILLIN 875 MG TABLET: 875 | 7 days supply | Qty: 14 | Fill #0

## 2018-02-04 IMAGING — DX DG WRIST COMPLETE 3+V*L*
4 series · 4 of 4 positions shown · non-contrast
Comparison: None.

CLINICAL DATA: Fell today with some deformity of the wrist, pain
and swelling

EXAM:
LEFT WRIST - COMPLETE 3+ VIEW

[wrist pa]
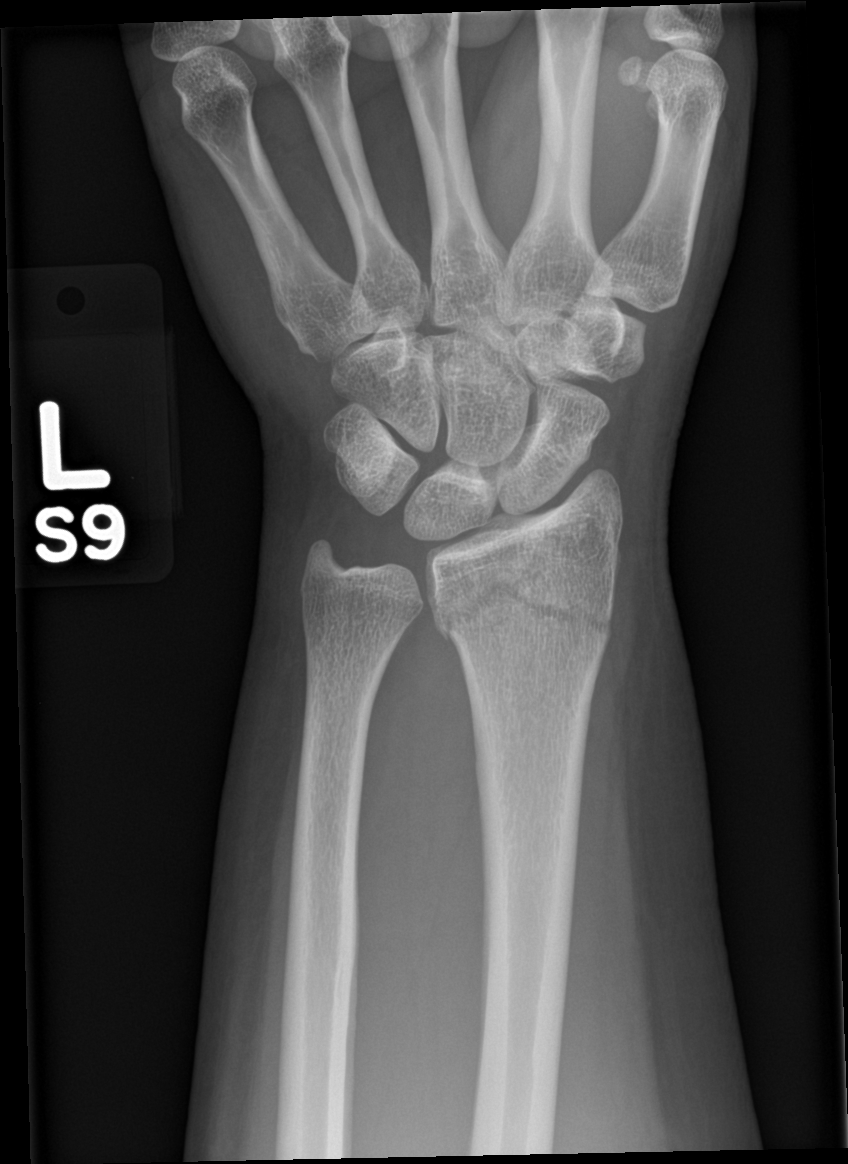

[wrist navicular]
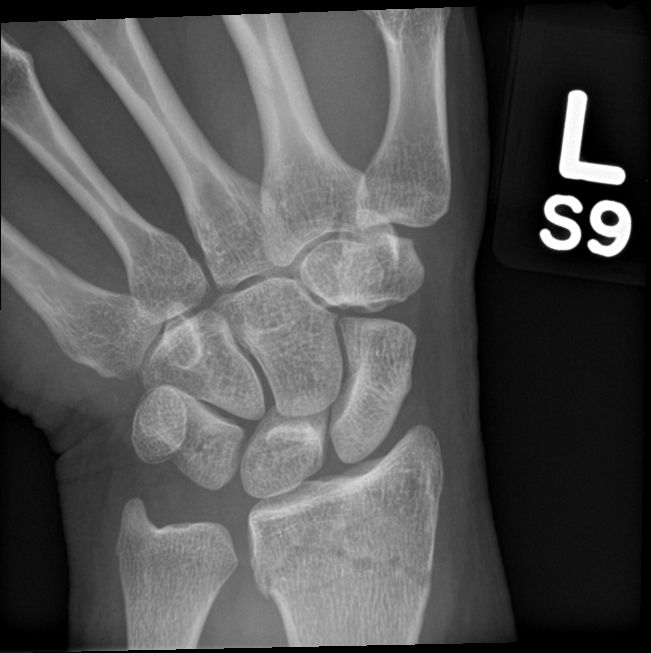

[wrist obl]
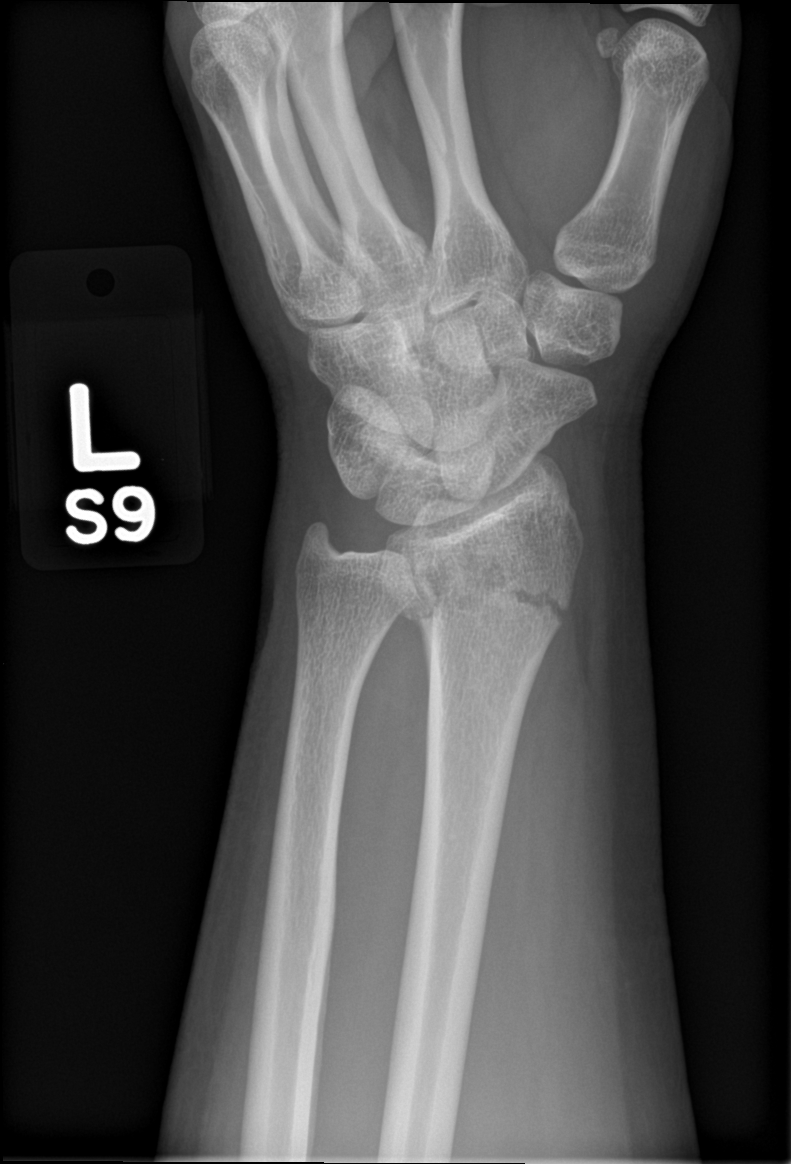

[wrist lat]
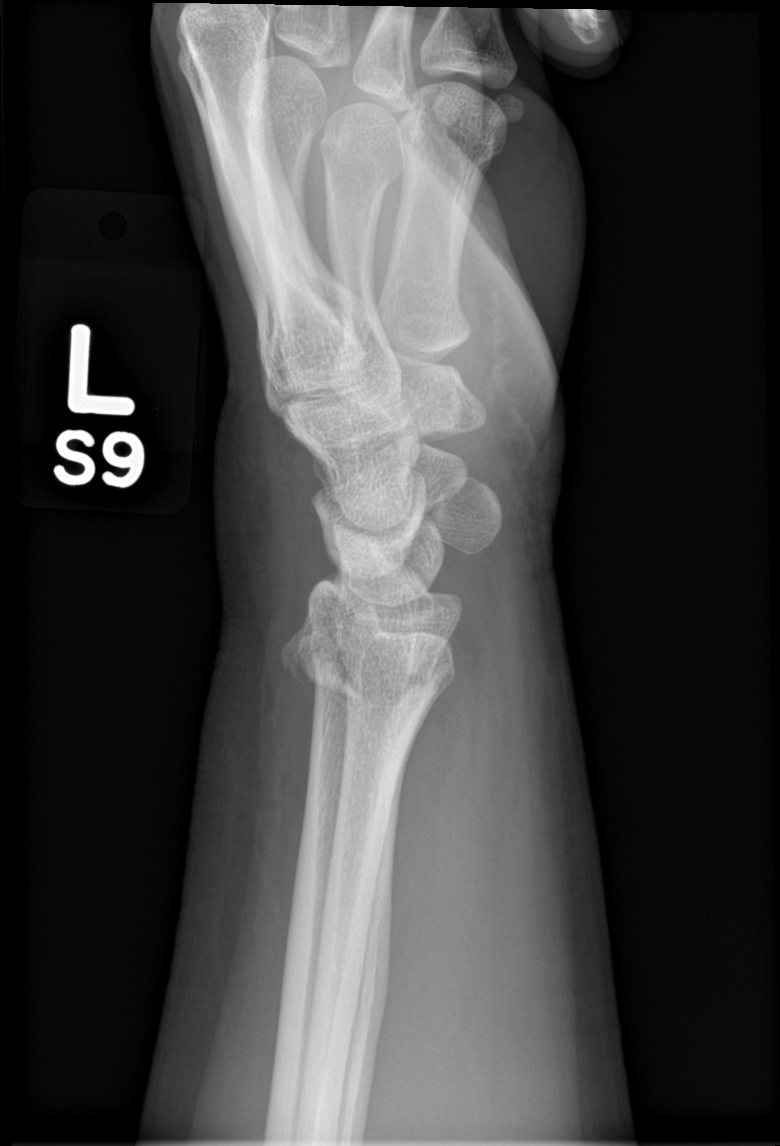

[4 of 4 positions shown; findings below may reference images not displayed]

FINDINGS: There is a transverse slightly angulated fracture of the distal left
radius. The distal left radial fragment tilts dorsally. This
fracture does not appear to extend into the radiocarpal joint space.
The ulnar styloid appears intact. The carpal bones are normal
position.
IMPRESSION: Slightly angulated transverse fracture of the distal left radius.

## 2018-07-04 IMAGING — MG 2D DIGITAL DIAGNOSTIC UNILATERAL LEFT MAMMOGRAM WITH CAD AND ADJ
6 of 9 series · 6 of 21 positions shown · non-contrast
Comparison: Previous exam(s).

CLINICAL DATA: Screening recall for left breast mass.

EXAM:
2D DIGITAL DIAGNOSTIC UNILATERAL LEFT MAMMOGRAM WITH CAD AND ADJUNCT
TOMO
LEFT BREAST ULTRASOUND

[L CC synth-2D (1 of 2)]
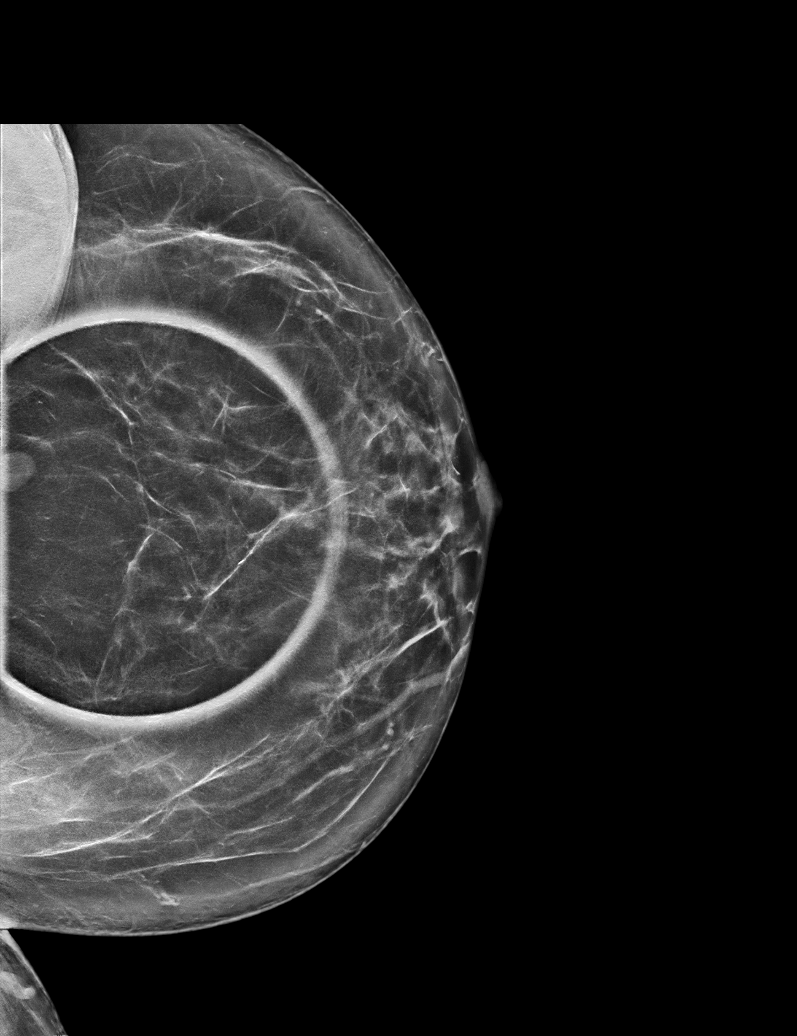

[L MLO synth-2D]
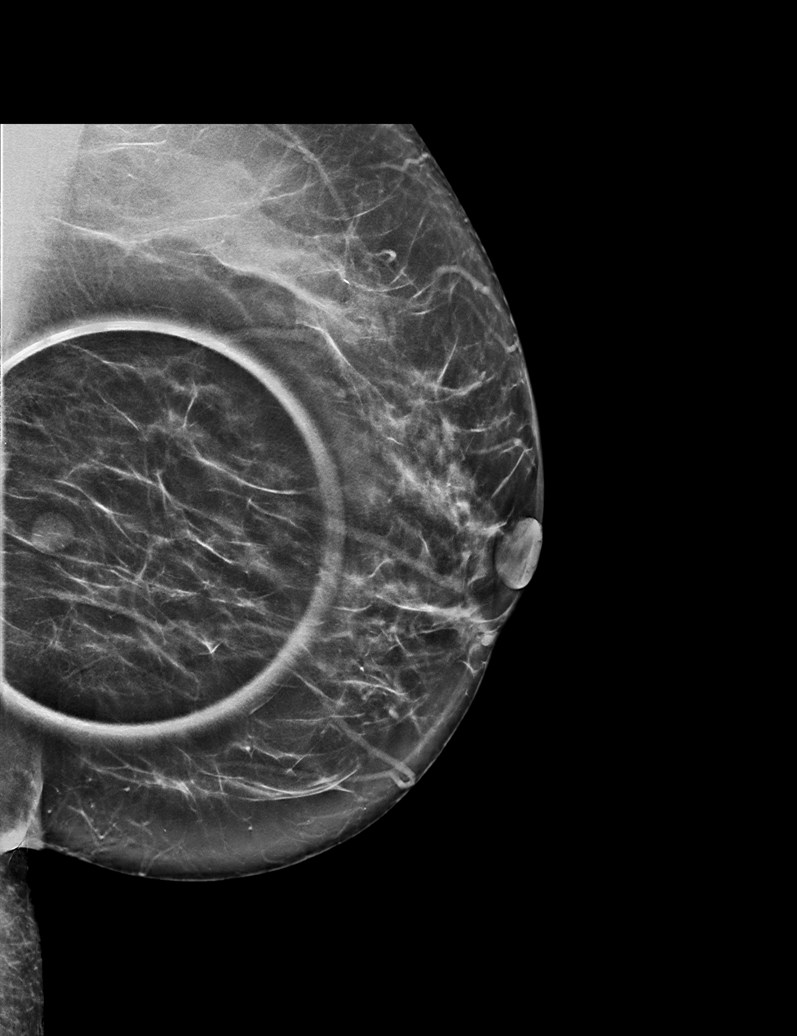

[L MLO]
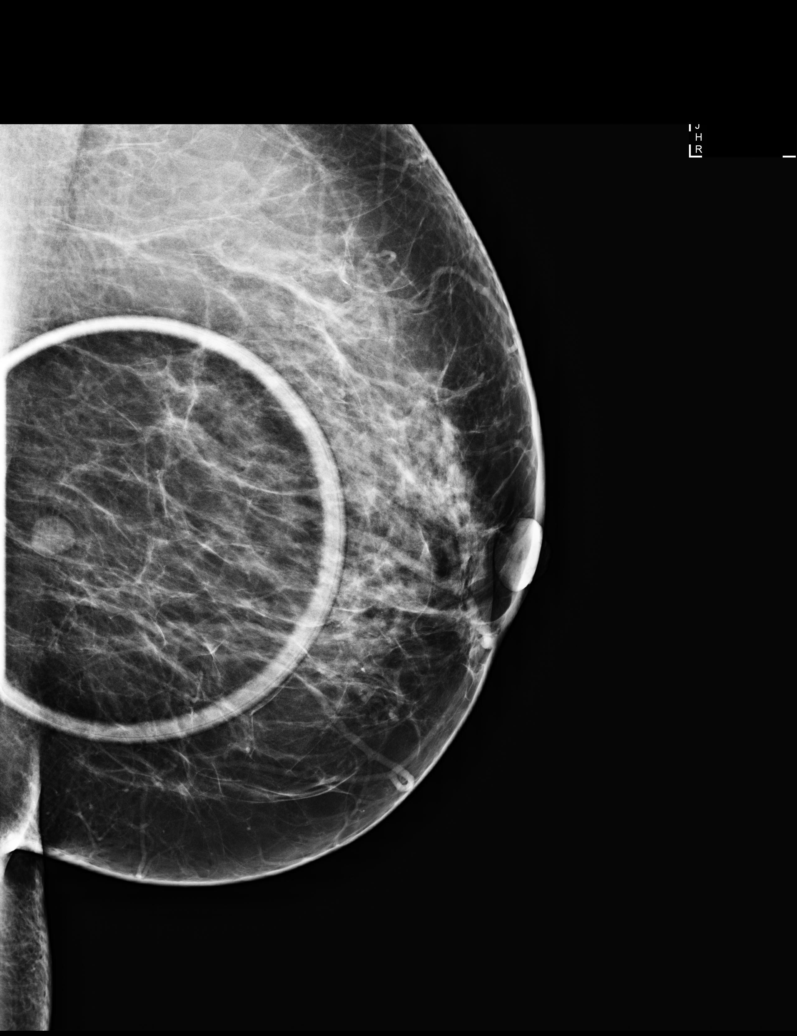

[L CC (1 of 2)]
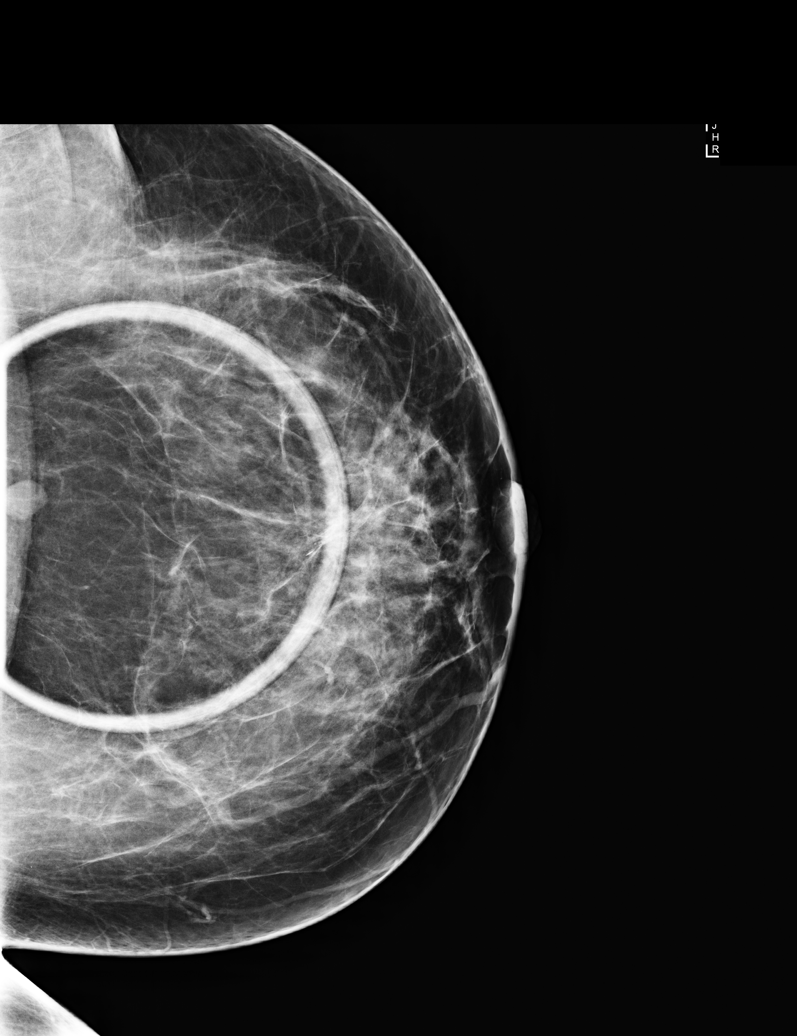

[L CC (2 of 2)]
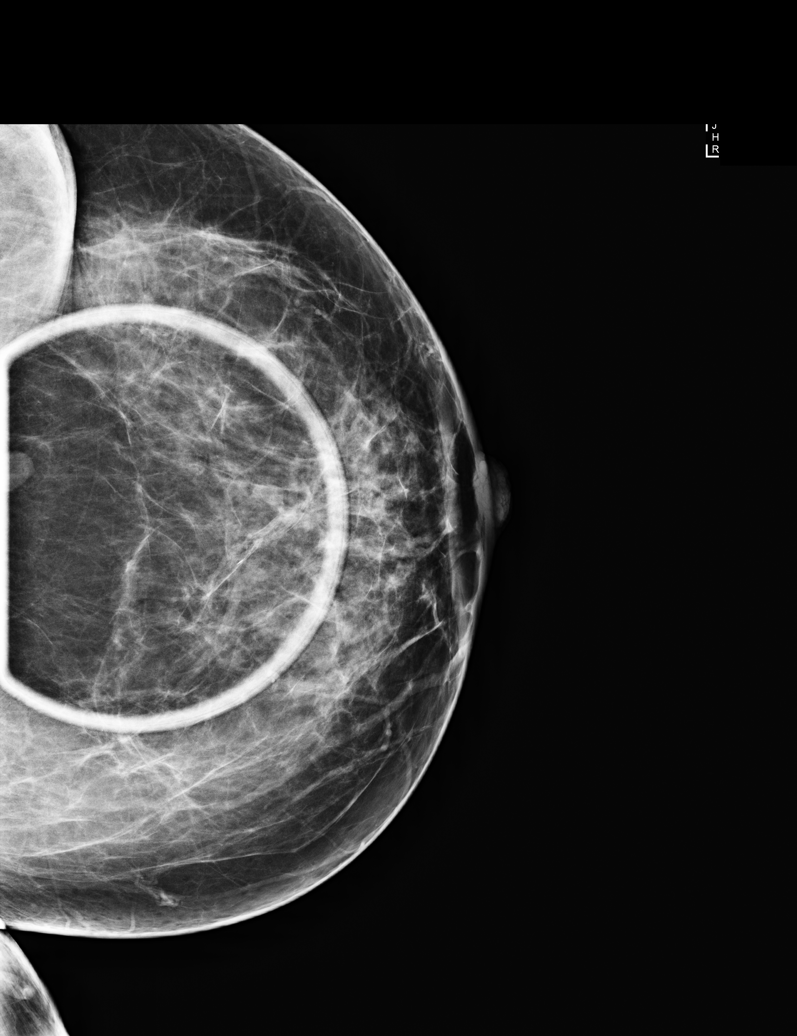

[L CC synth-2D (2 of 2)]
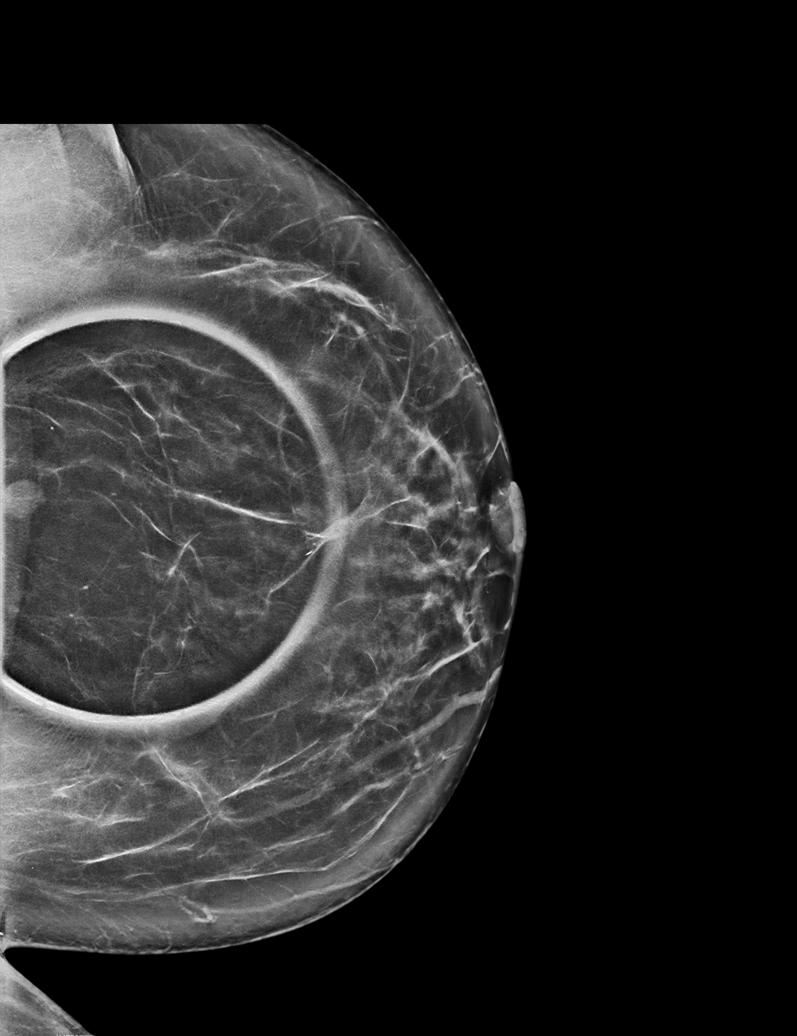

[6 of 21 positions shown; findings below may reference images not displayed]

ACR Breast Density Category c: The breast tissue is heterogeneously
dense, which may obscure small masses.
FINDINGS: In the lower outer quadrant of the left breast, far posterior depth
there is a circumscribed oval mass measuring approximately 9 mm.

Mammographic images were processed with CAD.

Physical exam of the inferior aspect of the left breast demonstrates
no discrete palpable masses.

Ultrasound targeted to the left breast at 6 o'clock, 3 cm from the
nipple demonstrates an isoechoic oval mass with circumscribed
margins measuring 7 x 6 x 7 mm. Due to its isoechoic appearance,
this is very difficult to identify sonographically.
IMPRESSION: There is a probably benign mass in left breast at 6 o'clock, which
is difficult to visualize sonographically.

RECOMMENDATION:
Six-month follow-up diagnostic left breast mammogram is recommended
with possible left breast ultrasound. The mass is very well
visualized mammographically, and difficult to see sonographically.

I have discussed the findings and recommendations with the patient.
Results were also provided in writing at the conclusion of the
visit. If applicable, a reminder letter will be sent to the patient
regarding the next appointment.

BI-RADS CATEGORY  3: Probably benign.

## 2018-10-28 DIAGNOSIS — E7800 Pure hypercholesterolemia, unspecified: Secondary | ICD-10-CM | POA: Diagnosis not present

## 2018-10-28 DIAGNOSIS — R030 Elevated blood-pressure reading, without diagnosis of hypertension: Secondary | ICD-10-CM | POA: Diagnosis not present

## 2018-11-06 DIAGNOSIS — E7800 Pure hypercholesterolemia, unspecified: Secondary | ICD-10-CM | POA: Diagnosis not present

## 2019-01-21 DIAGNOSIS — H5213 Myopia, bilateral: Secondary | ICD-10-CM | POA: Diagnosis not present

## 2019-05-19 DIAGNOSIS — R030 Elevated blood-pressure reading, without diagnosis of hypertension: Secondary | ICD-10-CM | POA: Diagnosis not present

## 2019-05-19 DIAGNOSIS — E7800 Pure hypercholesterolemia, unspecified: Secondary | ICD-10-CM | POA: Diagnosis not present

## 2019-10-28 DIAGNOSIS — F3281 Premenstrual dysphoric disorder: Secondary | ICD-10-CM | POA: Diagnosis not present

## 2019-10-28 DIAGNOSIS — Z0000 Encounter for general adult medical examination without abnormal findings: Secondary | ICD-10-CM | POA: Diagnosis not present

## 2019-10-28 DIAGNOSIS — Z0001 Encounter for general adult medical examination with abnormal findings: Secondary | ICD-10-CM | POA: Diagnosis not present

## 2019-10-28 MED FILL — LOSARTAN POTASSIUM 50 MG TA: 50 | 90 days supply | Qty: 90 | Fill #0

## 2019-11-03 DIAGNOSIS — Z1231 Encounter for screening mammogram for malignant neoplasm of breast: Secondary | ICD-10-CM | POA: Diagnosis not present

## 2019-11-09 DIAGNOSIS — Z0001 Encounter for general adult medical examination with abnormal findings: Secondary | ICD-10-CM | POA: Diagnosis not present

## 2019-11-09 DIAGNOSIS — E781 Pure hyperglyceridemia: Secondary | ICD-10-CM | POA: Diagnosis not present

## 2019-11-09 MED FILL — VIT D2 1.25 MG (50,000 UNIT: 1.25 MG | 84 days supply | Qty: 12 | Fill #0

## 2020-01-28 MED FILL — LOSARTAN POTASSIUM 50 MG TA: 50 | 90 days supply | Qty: 90 | Fill #0

## 2020-02-08 DIAGNOSIS — E559 Vitamin D deficiency, unspecified: Secondary | ICD-10-CM | POA: Diagnosis not present

## 2020-02-08 DIAGNOSIS — Z0001 Encounter for general adult medical examination with abnormal findings: Secondary | ICD-10-CM | POA: Diagnosis not present

## 2020-02-08 DIAGNOSIS — E781 Pure hyperglyceridemia: Secondary | ICD-10-CM | POA: Diagnosis not present

## 2020-02-08 MED FILL — VIT D2 1.25 MG (50,000 UNIT: 1.25 MG | 84 days supply | Qty: 12 | Fill #0

## 2020-04-04 MED FILL — LOSARTAN POTASSIUM 50 MG TA: 50 | 90 days supply | Qty: 180 | Fill #0

## 2020-04-08 DIAGNOSIS — H5213 Myopia, bilateral: Secondary | ICD-10-CM | POA: Diagnosis not present

## 2020-04-27 ENCOUNTER — Other Ambulatory Visit (HOSPITAL_COMMUNITY): Payer: Self-pay | Admitting: Family Medicine

## 2020-04-27 MED FILL — VIT D2 1.25 MG (50,000 UNIT: 1.25 MG | 84 days supply | Qty: 12 | Fill #0

## 2020-07-06 ENCOUNTER — Other Ambulatory Visit (HOSPITAL_COMMUNITY): Payer: Self-pay | Admitting: Family Medicine

## 2020-07-06 MED FILL — VIT D2 1.25 MG (50,000 UNIT: 1.25 MG | 84 days supply | Qty: 12 | Fill #0

## 2020-07-20 MED FILL — VIT D2 1.25 MG (50,000 UNIT: 1.25 MG | 84 days supply | Qty: 12 | Fill #0

## 2020-07-25 ENCOUNTER — Other Ambulatory Visit (HOSPITAL_COMMUNITY): Payer: Self-pay | Admitting: Family Medicine

## 2020-07-25 DIAGNOSIS — E781 Pure hyperglyceridemia: Secondary | ICD-10-CM | POA: Diagnosis not present

## 2020-07-25 DIAGNOSIS — F3281 Premenstrual dysphoric disorder: Secondary | ICD-10-CM | POA: Diagnosis not present

## 2020-07-25 DIAGNOSIS — R7301 Impaired fasting glucose: Secondary | ICD-10-CM | POA: Diagnosis not present

## 2020-07-25 DIAGNOSIS — E559 Vitamin D deficiency, unspecified: Secondary | ICD-10-CM | POA: Diagnosis not present

## 2020-07-25 DIAGNOSIS — I10 Essential (primary) hypertension: Secondary | ICD-10-CM | POA: Diagnosis not present

## 2020-07-25 DIAGNOSIS — Z6834 Body mass index (BMI) 34.0-34.9, adult: Secondary | ICD-10-CM | POA: Diagnosis not present

## 2020-07-25 DIAGNOSIS — Z0001 Encounter for general adult medical examination with abnormal findings: Secondary | ICD-10-CM | POA: Diagnosis not present

## 2020-07-25 MED FILL — LOSARTAN POTASSIUM 50 MG TA: 50 | 30 days supply | Qty: 60 | Fill #0

## 2020-08-21 MED FILL — LOSARTAN POTASSIUM 50 MG TA: 50 | 30 days supply | Qty: 60 | Fill #1

## 2020-09-22 MED FILL — LOSARTAN POTASSIUM 50 MG TA: 50 | 30 days supply | Qty: 60 | Fill #2

## 2020-10-24 DIAGNOSIS — E785 Hyperlipidemia, unspecified: Secondary | ICD-10-CM | POA: Diagnosis not present

## 2020-10-25 ENCOUNTER — Other Ambulatory Visit (HOSPITAL_COMMUNITY): Payer: Self-pay

## 2020-10-25 MED ORDER — LOSARTAN POTASSIUM 50 MG PO TABS
50.0000 mg | ORAL_TABLET | Freq: Two times a day (BID) | ORAL | 0 refills | Status: DC
  Filled 2020-10-25: qty 180, 90d supply, fill #0

## 2020-10-25 MED ORDER — VITAMIN D (ERGOCALCIFEROL) 1.25 MG (50000 UNIT) PO CAPS
1.0000 | ORAL_CAPSULE | ORAL | 0 refills | Status: DC
  Filled 2020-10-25: qty 12, 84d supply, fill #0

## 2020-11-03 DIAGNOSIS — Z1231 Encounter for screening mammogram for malignant neoplasm of breast: Secondary | ICD-10-CM | POA: Diagnosis not present

## 2021-01-19 ENCOUNTER — Other Ambulatory Visit (HOSPITAL_COMMUNITY): Payer: Self-pay

## 2021-01-19 MED ORDER — LOSARTAN POTASSIUM 50 MG PO TABS
50.0000 mg | ORAL_TABLET | Freq: Two times a day (BID) | ORAL | 0 refills | Status: AC
  Filled 2021-01-19: qty 180, 90d supply, fill #0

## 2021-01-19 MED ORDER — VITAMIN D (ERGOCALCIFEROL) 1.25 MG (50000 UNIT) PO CAPS
50000.0000 [IU] | ORAL_CAPSULE | ORAL | 0 refills | Status: AC
  Filled 2021-01-19: qty 12, 84d supply, fill #0

## 2021-01-23 ENCOUNTER — Other Ambulatory Visit (HOSPITAL_COMMUNITY): Payer: Self-pay

## 2021-04-09 DIAGNOSIS — H5213 Myopia, bilateral: Secondary | ICD-10-CM | POA: Diagnosis not present

## 2021-05-01 ENCOUNTER — Other Ambulatory Visit (HOSPITAL_COMMUNITY): Payer: Self-pay

## 2021-05-01 MED ORDER — VITAMIN D (ERGOCALCIFEROL) 1.25 MG (50000 UNIT) PO CAPS
50000.0000 [IU] | ORAL_CAPSULE | ORAL | 0 refills | Status: DC
  Filled 2021-05-01: qty 12, 84d supply, fill #0

## 2021-05-01 MED ORDER — LOSARTAN POTASSIUM 50 MG PO TABS
50.0000 mg | ORAL_TABLET | Freq: Two times a day (BID) | ORAL | 0 refills | Status: DC
  Filled 2021-05-01: qty 180, 90d supply, fill #0

## 2021-05-10 ENCOUNTER — Other Ambulatory Visit (HOSPITAL_COMMUNITY): Payer: Self-pay

## 2021-05-10 DIAGNOSIS — E559 Vitamin D deficiency, unspecified: Secondary | ICD-10-CM | POA: Diagnosis not present

## 2021-05-10 DIAGNOSIS — I10 Essential (primary) hypertension: Secondary | ICD-10-CM | POA: Diagnosis not present

## 2021-05-10 DIAGNOSIS — Z124 Encounter for screening for malignant neoplasm of cervix: Secondary | ICD-10-CM | POA: Diagnosis not present

## 2021-05-10 DIAGNOSIS — B372 Candidiasis of skin and nail: Secondary | ICD-10-CM | POA: Diagnosis not present

## 2021-05-10 DIAGNOSIS — L739 Follicular disorder, unspecified: Secondary | ICD-10-CM | POA: Diagnosis not present

## 2021-05-10 DIAGNOSIS — Z0001 Encounter for general adult medical examination with abnormal findings: Secondary | ICD-10-CM | POA: Diagnosis not present

## 2021-05-10 MED ORDER — FLUCONAZOLE 150 MG PO TABS
150.0000 mg | ORAL_TABLET | ORAL | 0 refills | Status: AC
  Filled 2021-05-10: qty 3, 6d supply, fill #0

## 2021-05-10 MED ORDER — DOXYCYCLINE HYCLATE 100 MG PO CAPS
100.0000 mg | ORAL_CAPSULE | Freq: Two times a day (BID) | ORAL | 0 refills | Status: AC
  Filled 2021-05-10: qty 14, 7d supply, fill #0

## 2021-05-10 MED ORDER — MUPIROCIN CALCIUM 2 % EX CREA
TOPICAL_CREAM | Freq: Two times a day (BID) | CUTANEOUS | 0 refills | Status: AC
  Filled 2021-05-10: qty 30, 14d supply, fill #0

## 2021-07-26 ENCOUNTER — Other Ambulatory Visit (HOSPITAL_COMMUNITY): Payer: Self-pay

## 2021-07-26 MED ORDER — VITAMIN D (ERGOCALCIFEROL) 1.25 MG (50000 UNIT) PO CAPS
50000.0000 [IU] | ORAL_CAPSULE | ORAL | 0 refills | Status: AC
  Filled 2021-07-26: qty 12, 84d supply, fill #0

## 2021-07-26 MED ORDER — LOSARTAN POTASSIUM 50 MG PO TABS
50.0000 mg | ORAL_TABLET | Freq: Two times a day (BID) | ORAL | 0 refills | Status: AC
  Filled 2021-07-26: qty 180, 90d supply, fill #0

## 2021-07-27 ENCOUNTER — Other Ambulatory Visit (HOSPITAL_COMMUNITY): Payer: Self-pay

## 2021-10-30 ENCOUNTER — Other Ambulatory Visit (HOSPITAL_COMMUNITY): Payer: Self-pay

## 2021-10-30 MED ORDER — LOSARTAN POTASSIUM 50 MG PO TABS
50.0000 mg | ORAL_TABLET | Freq: Two times a day (BID) | ORAL | 0 refills | Status: DC
  Filled 2021-10-30: qty 180, 90d supply, fill #0

## 2021-11-05 ENCOUNTER — Other Ambulatory Visit (HOSPITAL_COMMUNITY): Payer: Self-pay

## 2021-11-05 DIAGNOSIS — R7301 Impaired fasting glucose: Secondary | ICD-10-CM | POA: Diagnosis not present

## 2021-11-05 DIAGNOSIS — E781 Pure hyperglyceridemia: Secondary | ICD-10-CM | POA: Diagnosis not present

## 2021-11-05 DIAGNOSIS — I10 Essential (primary) hypertension: Secondary | ICD-10-CM | POA: Diagnosis not present

## 2021-11-05 DIAGNOSIS — E559 Vitamin D deficiency, unspecified: Secondary | ICD-10-CM | POA: Diagnosis not present

## 2021-11-05 MED ORDER — VITAMIN D (ERGOCALCIFEROL) 1.25 MG (50000 UNIT) PO CAPS
50000.0000 [IU] | ORAL_CAPSULE | ORAL | 0 refills | Status: AC
  Filled 2021-11-05: qty 12, 84d supply, fill #0

## 2021-11-05 MED ORDER — PROPRANOLOL HCL ER 60 MG PO CP24
60.0000 mg | ORAL_CAPSULE | Freq: Every day | ORAL | 0 refills | Status: DC
  Filled 2021-11-05: qty 90, 90d supply, fill #0

## 2021-11-09 DIAGNOSIS — Z1231 Encounter for screening mammogram for malignant neoplasm of breast: Secondary | ICD-10-CM | POA: Diagnosis not present

## 2022-01-28 ENCOUNTER — Other Ambulatory Visit (HOSPITAL_COMMUNITY): Payer: Self-pay

## 2022-01-28 MED ORDER — PROPRANOLOL HCL ER 60 MG PO CP24
60.0000 mg | ORAL_CAPSULE | Freq: Every day | ORAL | 0 refills | Status: DC
  Filled 2022-01-28: qty 90, 90d supply, fill #0

## 2022-01-28 MED ORDER — LOSARTAN POTASSIUM 50 MG PO TABS
50.0000 mg | ORAL_TABLET | Freq: Two times a day (BID) | ORAL | 0 refills | Status: DC
  Filled 2022-01-28: qty 180, 90d supply, fill #0

## 2022-04-10 DIAGNOSIS — H5213 Myopia, bilateral: Secondary | ICD-10-CM | POA: Diagnosis not present

## 2022-04-29 ENCOUNTER — Other Ambulatory Visit (HOSPITAL_COMMUNITY): Payer: Self-pay

## 2022-04-30 ENCOUNTER — Other Ambulatory Visit (HOSPITAL_COMMUNITY): Payer: Self-pay

## 2022-04-30 MED ORDER — PROPRANOLOL HCL ER 60 MG PO CP24
60.0000 mg | ORAL_CAPSULE | Freq: Every day | ORAL | 0 refills | Status: DC
  Filled 2022-04-30: qty 90, 90d supply, fill #0

## 2022-04-30 MED ORDER — LOSARTAN POTASSIUM 50 MG PO TABS
50.0000 mg | ORAL_TABLET | Freq: Two times a day (BID) | ORAL | 0 refills | Status: DC
  Filled 2022-04-30: qty 180, 90d supply, fill #0

## 2022-05-07 DIAGNOSIS — R7301 Impaired fasting glucose: Secondary | ICD-10-CM | POA: Diagnosis not present

## 2022-05-07 DIAGNOSIS — I10 Essential (primary) hypertension: Secondary | ICD-10-CM | POA: Diagnosis not present

## 2022-05-07 DIAGNOSIS — E781 Pure hyperglyceridemia: Secondary | ICD-10-CM | POA: Diagnosis not present

## 2022-05-07 DIAGNOSIS — Z0001 Encounter for general adult medical examination with abnormal findings: Secondary | ICD-10-CM | POA: Diagnosis not present

## 2022-05-07 DIAGNOSIS — E559 Vitamin D deficiency, unspecified: Secondary | ICD-10-CM | POA: Diagnosis not present

## 2022-05-21 DIAGNOSIS — I10 Essential (primary) hypertension: Secondary | ICD-10-CM | POA: Diagnosis not present

## 2022-05-21 DIAGNOSIS — Z0001 Encounter for general adult medical examination with abnormal findings: Secondary | ICD-10-CM | POA: Diagnosis not present

## 2022-05-21 DIAGNOSIS — E782 Mixed hyperlipidemia: Secondary | ICD-10-CM | POA: Diagnosis not present

## 2022-05-21 DIAGNOSIS — R7301 Impaired fasting glucose: Secondary | ICD-10-CM | POA: Diagnosis not present

## 2022-07-25 ENCOUNTER — Other Ambulatory Visit (HOSPITAL_COMMUNITY): Payer: Self-pay

## 2022-07-25 MED ORDER — PROPRANOLOL HCL ER 60 MG PO CP24
60.0000 mg | ORAL_CAPSULE | Freq: Every day | ORAL | 0 refills | Status: DC
  Filled 2022-07-25: qty 90, 90d supply, fill #0

## 2022-07-25 MED ORDER — LOSARTAN POTASSIUM 50 MG PO TABS
50.0000 mg | ORAL_TABLET | Freq: Two times a day (BID) | ORAL | 0 refills | Status: DC
  Filled 2022-07-25: qty 180, 90d supply, fill #0

## 2022-10-23 ENCOUNTER — Other Ambulatory Visit (HOSPITAL_COMMUNITY): Payer: Self-pay

## 2022-10-23 MED ORDER — PROPRANOLOL HCL ER 60 MG PO CP24
60.0000 mg | ORAL_CAPSULE | Freq: Every day | ORAL | 0 refills | Status: DC
  Filled 2022-10-23: qty 90, 90d supply, fill #0

## 2022-10-23 MED ORDER — LOSARTAN POTASSIUM 50 MG PO TABS
50.0000 mg | ORAL_TABLET | Freq: Two times a day (BID) | ORAL | 0 refills | Status: DC
  Filled 2022-10-23: qty 180, 90d supply, fill #0

## 2022-11-19 DIAGNOSIS — R7301 Impaired fasting glucose: Secondary | ICD-10-CM | POA: Diagnosis not present

## 2022-11-19 DIAGNOSIS — Z0001 Encounter for general adult medical examination with abnormal findings: Secondary | ICD-10-CM | POA: Diagnosis not present

## 2022-11-19 DIAGNOSIS — E782 Mixed hyperlipidemia: Secondary | ICD-10-CM | POA: Diagnosis not present

## 2022-11-19 DIAGNOSIS — I10 Essential (primary) hypertension: Secondary | ICD-10-CM | POA: Diagnosis not present

## 2022-11-19 DIAGNOSIS — E559 Vitamin D deficiency, unspecified: Secondary | ICD-10-CM | POA: Diagnosis not present

## 2022-11-19 DIAGNOSIS — E781 Pure hyperglyceridemia: Secondary | ICD-10-CM | POA: Diagnosis not present

## 2022-11-20 ENCOUNTER — Other Ambulatory Visit (HOSPITAL_COMMUNITY): Payer: Self-pay

## 2022-11-20 MED ORDER — VITAMIN D (ERGOCALCIFEROL) 1.25 MG (50000 UNIT) PO CAPS
50000.0000 [IU] | ORAL_CAPSULE | ORAL | 0 refills | Status: DC
  Filled 2022-11-20: qty 12, 84d supply, fill #0

## 2022-11-22 ENCOUNTER — Other Ambulatory Visit (HOSPITAL_COMMUNITY): Payer: Self-pay

## 2022-11-22 DIAGNOSIS — Z1231 Encounter for screening mammogram for malignant neoplasm of breast: Secondary | ICD-10-CM | POA: Diagnosis not present

## 2022-11-22 MED ORDER — PRAVASTATIN SODIUM 40 MG PO TABS
40.0000 mg | ORAL_TABLET | Freq: Every day | ORAL | 0 refills | Status: DC
  Filled 2022-11-22: qty 90, 90d supply, fill #0

## 2023-01-14 DIAGNOSIS — Z0001 Encounter for general adult medical examination with abnormal findings: Secondary | ICD-10-CM | POA: Diagnosis not present

## 2023-01-14 DIAGNOSIS — E781 Pure hyperglyceridemia: Secondary | ICD-10-CM | POA: Diagnosis not present

## 2023-01-14 DIAGNOSIS — E782 Mixed hyperlipidemia: Secondary | ICD-10-CM | POA: Diagnosis not present

## 2023-01-21 ENCOUNTER — Other Ambulatory Visit (HOSPITAL_COMMUNITY): Payer: Self-pay

## 2023-01-21 MED ORDER — LOSARTAN POTASSIUM 50 MG PO TABS
50.0000 mg | ORAL_TABLET | Freq: Two times a day (BID) | ORAL | 0 refills | Status: AC
  Filled 2023-01-21: qty 180, 90d supply, fill #0

## 2023-01-21 MED ORDER — PRAVASTATIN SODIUM 40 MG PO TABS
40.0000 mg | ORAL_TABLET | Freq: Every day | ORAL | 0 refills | Status: AC
  Filled 2023-01-21 – 2023-02-11 (×3): qty 90, 90d supply, fill #0

## 2023-01-21 MED ORDER — PROPRANOLOL HCL ER 60 MG PO CP24
60.0000 mg | ORAL_CAPSULE | Freq: Every day | ORAL | 0 refills | Status: AC
  Filled 2023-01-21: qty 90, 90d supply, fill #0

## 2023-01-25 ENCOUNTER — Other Ambulatory Visit (HOSPITAL_COMMUNITY): Payer: Self-pay

## 2023-02-11 ENCOUNTER — Other Ambulatory Visit (HOSPITAL_COMMUNITY): Payer: Self-pay

## 2023-05-13 ENCOUNTER — Other Ambulatory Visit: Payer: Self-pay

## 2023-05-13 ENCOUNTER — Other Ambulatory Visit (HOSPITAL_COMMUNITY): Payer: Self-pay

## 2023-05-13 MED ORDER — PROPRANOLOL HCL ER 60 MG PO CP24
60.0000 mg | ORAL_CAPSULE | Freq: Every day | ORAL | 0 refills | Status: DC
  Filled 2023-05-13: qty 90, 90d supply, fill #0

## 2023-05-19 ENCOUNTER — Other Ambulatory Visit (HOSPITAL_COMMUNITY): Payer: Self-pay

## 2023-05-19 DIAGNOSIS — M25461 Effusion, right knee: Secondary | ICD-10-CM | POA: Diagnosis not present

## 2023-05-19 DIAGNOSIS — M25561 Pain in right knee: Secondary | ICD-10-CM | POA: Diagnosis not present

## 2023-05-19 MED ORDER — METHYLPREDNISOLONE 4 MG PO TBPK
ORAL_TABLET | ORAL | 0 refills | Status: AC
  Filled 2023-05-19: qty 21, 6d supply, fill #0

## 2023-05-19 MED ORDER — MELOXICAM 15 MG PO TABS
15.0000 mg | ORAL_TABLET | Freq: Every day | ORAL | 0 refills | Status: DC
  Filled 2023-05-19: qty 30, 30d supply, fill #0

## 2023-05-22 ENCOUNTER — Other Ambulatory Visit (HOSPITAL_COMMUNITY): Payer: Self-pay

## 2023-05-22 DIAGNOSIS — I10 Essential (primary) hypertension: Secondary | ICD-10-CM | POA: Diagnosis not present

## 2023-05-22 DIAGNOSIS — R7301 Impaired fasting glucose: Secondary | ICD-10-CM | POA: Diagnosis not present

## 2023-05-22 DIAGNOSIS — M25561 Pain in right knee: Secondary | ICD-10-CM | POA: Diagnosis not present

## 2023-05-22 DIAGNOSIS — E782 Mixed hyperlipidemia: Secondary | ICD-10-CM | POA: Diagnosis not present

## 2023-05-22 MED ORDER — PRAVASTATIN SODIUM 40 MG PO TABS
40.0000 mg | ORAL_TABLET | Freq: Every day | ORAL | 0 refills | Status: DC
  Filled 2023-05-22: qty 90, 90d supply, fill #0

## 2023-05-22 MED ORDER — VITAMIN D (ERGOCALCIFEROL) 1.25 MG (50000 UNIT) PO CAPS
50000.0000 [IU] | ORAL_CAPSULE | ORAL | 0 refills | Status: DC
  Filled 2023-05-22: qty 12, 84d supply, fill #0

## 2023-05-22 MED ORDER — LOSARTAN POTASSIUM 50 MG PO TABS
50.0000 mg | ORAL_TABLET | Freq: Two times a day (BID) | ORAL | 0 refills | Status: DC
  Filled 2023-05-22: qty 180, 90d supply, fill #0

## 2023-05-30 DIAGNOSIS — Z1211 Encounter for screening for malignant neoplasm of colon: Secondary | ICD-10-CM | POA: Diagnosis not present

## 2023-06-03 ENCOUNTER — Other Ambulatory Visit (HOSPITAL_COMMUNITY): Payer: Self-pay

## 2023-06-03 DIAGNOSIS — Z124 Encounter for screening for malignant neoplasm of cervix: Secondary | ICD-10-CM | POA: Diagnosis not present

## 2023-06-03 DIAGNOSIS — Z0001 Encounter for general adult medical examination with abnormal findings: Secondary | ICD-10-CM | POA: Diagnosis not present

## 2023-06-03 DIAGNOSIS — I10 Essential (primary) hypertension: Secondary | ICD-10-CM | POA: Diagnosis not present

## 2023-06-03 DIAGNOSIS — M25461 Effusion, right knee: Secondary | ICD-10-CM | POA: Diagnosis not present

## 2023-06-03 MED ORDER — MELOXICAM 15 MG PO TABS
15.0000 mg | ORAL_TABLET | Freq: Every day | ORAL | 0 refills | Status: AC
  Filled 2023-06-03: qty 30, 30d supply, fill #0

## 2023-08-05 ENCOUNTER — Other Ambulatory Visit (HOSPITAL_COMMUNITY): Payer: Self-pay

## 2023-08-05 MED ORDER — PROPRANOLOL HCL ER 60 MG PO CP24
60.0000 mg | ORAL_CAPSULE | Freq: Every day | ORAL | 0 refills | Status: AC
  Filled 2023-08-05: qty 90, 90d supply, fill #0

## 2023-08-26 ENCOUNTER — Other Ambulatory Visit (HOSPITAL_COMMUNITY): Payer: Self-pay

## 2023-08-26 MED ORDER — PRAVASTATIN SODIUM 40 MG PO TABS
40.0000 mg | ORAL_TABLET | Freq: Every day | ORAL | 0 refills | Status: DC
  Filled 2023-08-26: qty 90, 90d supply, fill #0

## 2023-08-26 MED ORDER — LOSARTAN POTASSIUM 50 MG PO TABS
50.0000 mg | ORAL_TABLET | Freq: Two times a day (BID) | ORAL | 0 refills | Status: AC
  Filled 2023-08-26: qty 180, 90d supply, fill #0

## 2023-08-27 ENCOUNTER — Other Ambulatory Visit (HOSPITAL_COMMUNITY): Payer: Self-pay

## 2023-09-16 ENCOUNTER — Other Ambulatory Visit (HOSPITAL_COMMUNITY): Payer: Self-pay

## 2023-09-17 ENCOUNTER — Other Ambulatory Visit (HOSPITAL_COMMUNITY): Payer: Self-pay

## 2023-09-17 MED ORDER — VITAMIN D (ERGOCALCIFEROL) 1.25 MG (50000 UNIT) PO CAPS
50000.0000 [IU] | ORAL_CAPSULE | ORAL | 0 refills | Status: DC
  Filled 2023-09-17: qty 12, 84d supply, fill #0

## 2023-11-04 ENCOUNTER — Other Ambulatory Visit (HOSPITAL_COMMUNITY): Payer: Self-pay

## 2023-11-04 DIAGNOSIS — Z6834 Body mass index (BMI) 34.0-34.9, adult: Secondary | ICD-10-CM | POA: Diagnosis not present

## 2023-11-04 DIAGNOSIS — E559 Vitamin D deficiency, unspecified: Secondary | ICD-10-CM | POA: Diagnosis not present

## 2023-11-04 DIAGNOSIS — R7301 Impaired fasting glucose: Secondary | ICD-10-CM | POA: Diagnosis not present

## 2023-11-04 DIAGNOSIS — R5383 Other fatigue: Secondary | ICD-10-CM | POA: Diagnosis not present

## 2023-11-04 DIAGNOSIS — Z0001 Encounter for general adult medical examination with abnormal findings: Secondary | ICD-10-CM | POA: Diagnosis not present

## 2023-11-04 DIAGNOSIS — E782 Mixed hyperlipidemia: Secondary | ICD-10-CM | POA: Diagnosis not present

## 2023-11-04 DIAGNOSIS — I10 Essential (primary) hypertension: Secondary | ICD-10-CM | POA: Diagnosis not present

## 2023-11-04 MED ORDER — PROPRANOLOL HCL ER 120 MG PO CP24
120.0000 mg | ORAL_CAPSULE | Freq: Every day | ORAL | 0 refills | Status: DC
  Filled 2023-11-04: qty 90, 90d supply, fill #0

## 2023-11-04 MED ORDER — PRAVASTATIN SODIUM 40 MG PO TABS
40.0000 mg | ORAL_TABLET | Freq: Every day | ORAL | 0 refills | Status: DC
  Filled 2023-11-04 – 2023-11-26 (×2): qty 90, 90d supply, fill #0

## 2023-11-04 MED ORDER — LOSARTAN POTASSIUM 100 MG PO TABS
100.0000 mg | ORAL_TABLET | Freq: Every day | ORAL | 0 refills | Status: DC
  Filled 2023-11-04: qty 90, 90d supply, fill #0

## 2023-11-10 ENCOUNTER — Other Ambulatory Visit (HOSPITAL_COMMUNITY): Payer: Self-pay

## 2023-11-10 MED ORDER — MONTELUKAST SODIUM 10 MG PO TABS
10.0000 mg | ORAL_TABLET | Freq: Every evening | ORAL | 0 refills | Status: AC
  Filled 2023-11-10: qty 90, 90d supply, fill #0

## 2023-11-12 DIAGNOSIS — R7301 Impaired fasting glucose: Secondary | ICD-10-CM | POA: Diagnosis not present

## 2023-11-12 DIAGNOSIS — E782 Mixed hyperlipidemia: Secondary | ICD-10-CM | POA: Diagnosis not present

## 2023-11-12 DIAGNOSIS — E559 Vitamin D deficiency, unspecified: Secondary | ICD-10-CM | POA: Diagnosis not present

## 2023-11-12 DIAGNOSIS — I10 Essential (primary) hypertension: Secondary | ICD-10-CM | POA: Diagnosis not present

## 2023-11-26 ENCOUNTER — Other Ambulatory Visit (HOSPITAL_COMMUNITY): Payer: Self-pay

## 2023-11-28 DIAGNOSIS — Z1231 Encounter for screening mammogram for malignant neoplasm of breast: Secondary | ICD-10-CM | POA: Diagnosis not present

## 2024-01-26 ENCOUNTER — Other Ambulatory Visit (HOSPITAL_COMMUNITY): Payer: Self-pay

## 2024-01-26 ENCOUNTER — Other Ambulatory Visit: Payer: Self-pay

## 2024-01-26 MED ORDER — PROPRANOLOL HCL ER 120 MG PO CP24
120.0000 mg | ORAL_CAPSULE | Freq: Every day | ORAL | 0 refills | Status: DC
  Filled 2024-01-26: qty 90, 90d supply, fill #0

## 2024-03-02 DIAGNOSIS — M6281 Muscle weakness (generalized): Secondary | ICD-10-CM | POA: Diagnosis not present

## 2024-03-02 DIAGNOSIS — N3946 Mixed incontinence: Secondary | ICD-10-CM | POA: Diagnosis not present

## 2024-03-02 DIAGNOSIS — M6289 Other specified disorders of muscle: Secondary | ICD-10-CM | POA: Diagnosis not present

## 2024-03-08 ENCOUNTER — Other Ambulatory Visit (HOSPITAL_COMMUNITY): Payer: Self-pay

## 2024-03-09 ENCOUNTER — Other Ambulatory Visit (HOSPITAL_COMMUNITY): Payer: Self-pay

## 2024-03-09 MED ORDER — LOSARTAN POTASSIUM 100 MG PO TABS
100.0000 mg | ORAL_TABLET | Freq: Every day | ORAL | 0 refills | Status: DC
  Filled 2024-03-09: qty 90, 90d supply, fill #0

## 2024-03-09 MED ORDER — PRAVASTATIN SODIUM 40 MG PO TABS
40.0000 mg | ORAL_TABLET | Freq: Every day | ORAL | 0 refills | Status: DC
  Filled 2024-03-09: qty 90, 90d supply, fill #0

## 2024-05-06 ENCOUNTER — Other Ambulatory Visit (HOSPITAL_COMMUNITY): Payer: Self-pay

## 2024-05-08 MED ORDER — PROPRANOLOL HCL ER 120 MG PO CP24
120.0000 mg | ORAL_CAPSULE | Freq: Every day | ORAL | 0 refills | Status: DC
  Filled 2024-05-08: qty 90, 90d supply, fill #0

## 2024-05-09 ENCOUNTER — Other Ambulatory Visit (HOSPITAL_COMMUNITY): Payer: Self-pay

## 2024-05-10 ENCOUNTER — Other Ambulatory Visit (HOSPITAL_COMMUNITY): Payer: Self-pay

## 2024-05-12 DIAGNOSIS — N3946 Mixed incontinence: Secondary | ICD-10-CM | POA: Diagnosis not present

## 2024-05-12 DIAGNOSIS — M62838 Other muscle spasm: Secondary | ICD-10-CM | POA: Diagnosis not present

## 2024-05-12 DIAGNOSIS — M6289 Other specified disorders of muscle: Secondary | ICD-10-CM | POA: Diagnosis not present

## 2024-05-12 DIAGNOSIS — M6281 Muscle weakness (generalized): Secondary | ICD-10-CM | POA: Diagnosis not present

## 2024-05-31 ENCOUNTER — Other Ambulatory Visit (HOSPITAL_BASED_OUTPATIENT_CLINIC_OR_DEPARTMENT_OTHER): Payer: Self-pay

## 2024-05-31 ENCOUNTER — Other Ambulatory Visit: Payer: Self-pay

## 2024-05-31 ENCOUNTER — Other Ambulatory Visit (HOSPITAL_COMMUNITY): Payer: Self-pay

## 2024-05-31 MED ORDER — VITAMIN D (ERGOCALCIFEROL) 1.25 MG (50000 UNIT) PO CAPS
50000.0000 [IU] | ORAL_CAPSULE | ORAL | 0 refills | Status: DC
  Filled 2024-05-31: qty 12, 84d supply, fill #0

## 2024-05-31 MED ORDER — LOSARTAN POTASSIUM 100 MG PO TABS
100.0000 mg | ORAL_TABLET | Freq: Every day | ORAL | 0 refills | Status: AC
  Filled 2024-05-31: qty 90, 90d supply, fill #0

## 2024-05-31 MED ORDER — PRAVASTATIN SODIUM 40 MG PO TABS
40.0000 mg | ORAL_TABLET | Freq: Every day | ORAL | 0 refills | Status: AC
  Filled 2024-05-31 (×2): qty 90, 90d supply, fill #0

## 2024-06-01 DIAGNOSIS — Z0001 Encounter for general adult medical examination with abnormal findings: Secondary | ICD-10-CM | POA: Diagnosis not present

## 2024-06-01 DIAGNOSIS — N926 Irregular menstruation, unspecified: Secondary | ICD-10-CM | POA: Diagnosis not present

## 2024-06-01 DIAGNOSIS — F4320 Adjustment disorder, unspecified: Secondary | ICD-10-CM | POA: Diagnosis not present

## 2024-08-05 ENCOUNTER — Other Ambulatory Visit (HOSPITAL_COMMUNITY): Payer: Self-pay

## 2024-08-05 ENCOUNTER — Other Ambulatory Visit: Payer: Self-pay

## 2024-08-05 MED ORDER — VITAMIN D (ERGOCALCIFEROL) 1.25 MG (50000 UNIT) PO CAPS
50000.0000 [IU] | ORAL_CAPSULE | ORAL | 0 refills | Status: AC
  Filled 2024-08-05 – 2024-08-06 (×2): qty 12, 84d supply, fill #0

## 2024-08-05 MED ORDER — PROPRANOLOL HCL ER 120 MG PO CP24
120.0000 mg | ORAL_CAPSULE | Freq: Every day | ORAL | 0 refills | Status: AC
  Filled 2024-08-05: qty 90, 90d supply, fill #0

## 2024-08-06 ENCOUNTER — Other Ambulatory Visit (HOSPITAL_COMMUNITY): Payer: Self-pay

## 2024-08-06 ENCOUNTER — Other Ambulatory Visit: Payer: Self-pay
